# Patient Record
Sex: Male | Born: 1980 | Race: White | Hispanic: No | Marital: Single | Smoking: Former smoker
Health system: Southern US, Community
[De-identification: ages and names within clinical notes are randomized; demographics above are authoritative.]

## PROBLEM LIST (undated history)

## (undated) DIAGNOSIS — T8859XA Other complications of anesthesia, initial encounter: Secondary | ICD-10-CM

## (undated) DIAGNOSIS — F4310 Post-traumatic stress disorder, unspecified: Secondary | ICD-10-CM

## (undated) DIAGNOSIS — I4891 Unspecified atrial fibrillation: Secondary | ICD-10-CM

## (undated) DIAGNOSIS — G4730 Sleep apnea, unspecified: Secondary | ICD-10-CM

## (undated) DIAGNOSIS — K219 Gastro-esophageal reflux disease without esophagitis: Secondary | ICD-10-CM

## (undated) DIAGNOSIS — J342 Deviated nasal septum: Secondary | ICD-10-CM

## (undated) DIAGNOSIS — T4145XA Adverse effect of unspecified anesthetic, initial encounter: Secondary | ICD-10-CM

## (undated) DIAGNOSIS — I498 Other specified cardiac arrhythmias: Secondary | ICD-10-CM

## (undated) DIAGNOSIS — E162 Hypoglycemia, unspecified: Secondary | ICD-10-CM

## (undated) DIAGNOSIS — F411 Generalized anxiety disorder: Principal | ICD-10-CM

## (undated) DIAGNOSIS — F329 Major depressive disorder, single episode, unspecified: Secondary | ICD-10-CM

## (undated) DIAGNOSIS — I10 Essential (primary) hypertension: Secondary | ICD-10-CM

## (undated) HISTORY — DX: Major depressive disorder, single episode, unspecified: F32.9

## (undated) HISTORY — PX: CHOLECYSTECTOMY: SHX55

## (undated) HISTORY — DX: Morbid (severe) obesity due to excess calories: E66.01

## (undated) HISTORY — DX: Generalized anxiety disorder: F41.1

## (undated) HISTORY — DX: Deviated nasal septum: J34.2

## (undated) HISTORY — DX: Other specified cardiac arrhythmias: I49.8

## (undated) HISTORY — PX: DENTAL SURGERY: SHX609

## (undated) HISTORY — DX: Sleep apnea, unspecified: G47.30

## (undated) HISTORY — DX: Unspecified atrial fibrillation: I48.91

## (undated) HISTORY — PX: OTHER SURGICAL HISTORY: SHX169

---

## 1999-12-26 ENCOUNTER — Ambulatory Visit (HOSPITAL_BASED_OUTPATIENT_CLINIC_OR_DEPARTMENT_OTHER): Admission: RE | Admit: 1999-12-26 | Discharge: 1999-12-26 | Payer: Self-pay | Admitting: Psychiatry

## 2003-07-04 ENCOUNTER — Emergency Department (HOSPITAL_COMMUNITY): Admission: EM | Admit: 2003-07-04 | Discharge: 2003-07-05 | Payer: Self-pay | Admitting: Emergency Medicine

## 2003-07-14 ENCOUNTER — Ambulatory Visit (HOSPITAL_COMMUNITY): Admission: RE | Admit: 2003-07-14 | Discharge: 2003-07-15 | Payer: Self-pay | Admitting: Internal Medicine

## 2004-04-06 ENCOUNTER — Ambulatory Visit: Payer: Self-pay | Admitting: Internal Medicine

## 2004-08-31 ENCOUNTER — Emergency Department (HOSPITAL_COMMUNITY): Admission: EM | Admit: 2004-08-31 | Discharge: 2004-09-01 | Payer: Self-pay

## 2004-09-06 ENCOUNTER — Ambulatory Visit: Payer: Self-pay | Admitting: Internal Medicine

## 2004-09-20 ENCOUNTER — Ambulatory Visit: Payer: Self-pay | Admitting: Internal Medicine

## 2004-10-09 ENCOUNTER — Encounter: Admission: RE | Admit: 2004-10-09 | Discharge: 2004-10-09 | Payer: Self-pay | Admitting: Sports Medicine

## 2005-04-23 ENCOUNTER — Ambulatory Visit: Payer: Self-pay | Admitting: Family Medicine

## 2005-07-19 ENCOUNTER — Ambulatory Visit: Payer: Self-pay | Admitting: Internal Medicine

## 2005-07-27 ENCOUNTER — Ambulatory Visit: Payer: Self-pay | Admitting: Internal Medicine

## 2005-08-06 ENCOUNTER — Ambulatory Visit: Payer: Self-pay | Admitting: Internal Medicine

## 2006-01-08 ENCOUNTER — Ambulatory Visit: Payer: Self-pay | Admitting: Family Medicine

## 2007-02-05 ENCOUNTER — Ambulatory Visit: Payer: Self-pay | Admitting: Internal Medicine

## 2007-03-27 DIAGNOSIS — F411 Generalized anxiety disorder: Secondary | ICD-10-CM

## 2007-03-27 DIAGNOSIS — G4730 Sleep apnea, unspecified: Secondary | ICD-10-CM

## 2007-03-27 DIAGNOSIS — G4733 Obstructive sleep apnea (adult) (pediatric): Secondary | ICD-10-CM

## 2007-03-27 DIAGNOSIS — I498 Other specified cardiac arrhythmias: Secondary | ICD-10-CM

## 2007-03-27 HISTORY — DX: Generalized anxiety disorder: F41.1

## 2007-03-27 HISTORY — DX: Sleep apnea, unspecified: G47.30

## 2007-03-27 HISTORY — DX: Other specified cardiac arrhythmias: I49.8

## 2007-04-09 ENCOUNTER — Ambulatory Visit: Payer: Self-pay | Admitting: Internal Medicine

## 2007-04-09 LAB — CONVERTED CEMR LAB
AST: 23 units/L (ref 0–37)
Albumin: 4 g/dL (ref 3.5–5.2)
Basophils Relative: 0.7 % (ref 0.0–1.0)
Bilirubin, Direct: 0.1 mg/dL (ref 0.0–0.3)
Blood in Urine, dipstick: NEGATIVE
Chloride: 103 meq/L (ref 96–112)
Creatinine, Ser: 0.9 mg/dL (ref 0.4–1.5)
Creatinine,U: 124.7 mg/dL
Direct LDL: 96.1 mg/dL
Eosinophils Relative: 7.9 % — ABNORMAL HIGH (ref 0.0–5.0)
Glucose, Bld: 102 mg/dL — ABNORMAL HIGH (ref 70–99)
HCT: 44.2 % (ref 39.0–52.0)
Neutrophils Relative %: 64.2 % (ref 43.0–77.0)
Nitrite: NEGATIVE
Protein, U semiquant: NEGATIVE
RBC: 5.14 M/uL (ref 4.22–5.81)
RDW: 12 % (ref 11.5–14.6)
Sodium: 140 meq/L (ref 135–145)
Total Bilirubin: 0.6 mg/dL (ref 0.3–1.2)
Total CHOL/HDL Ratio: 6.6
Triglycerides: 224 mg/dL (ref 0–149)
WBC Urine, dipstick: NEGATIVE
WBC: 7.8 10*3/uL (ref 4.5–10.5)

## 2007-04-15 ENCOUNTER — Ambulatory Visit: Payer: Self-pay | Admitting: Internal Medicine

## 2007-04-15 DIAGNOSIS — J342 Deviated nasal septum: Secondary | ICD-10-CM

## 2007-04-15 HISTORY — DX: Deviated nasal septum: J34.2

## 2007-04-23 ENCOUNTER — Telehealth: Payer: Self-pay | Admitting: *Deleted

## 2007-05-19 ENCOUNTER — Encounter: Payer: Self-pay | Admitting: Internal Medicine

## 2007-06-16 ENCOUNTER — Ambulatory Visit: Payer: Self-pay | Admitting: Internal Medicine

## 2007-06-16 DIAGNOSIS — I4891 Unspecified atrial fibrillation: Secondary | ICD-10-CM | POA: Insufficient documentation

## 2007-06-16 DIAGNOSIS — F329 Major depressive disorder, single episode, unspecified: Secondary | ICD-10-CM

## 2007-06-16 DIAGNOSIS — F3289 Other specified depressive episodes: Secondary | ICD-10-CM

## 2007-06-16 HISTORY — DX: Unspecified atrial fibrillation: I48.91

## 2007-06-16 HISTORY — DX: Other specified depressive episodes: F32.89

## 2007-06-16 HISTORY — DX: Major depressive disorder, single episode, unspecified: F32.9

## 2007-07-31 ENCOUNTER — Ambulatory Visit: Payer: Self-pay | Admitting: Internal Medicine

## 2007-07-31 DIAGNOSIS — L270 Generalized skin eruption due to drugs and medicaments taken internally: Secondary | ICD-10-CM | POA: Insufficient documentation

## 2007-08-18 ENCOUNTER — Telehealth: Payer: Self-pay | Admitting: Internal Medicine

## 2007-11-03 ENCOUNTER — Telehealth: Payer: Self-pay | Admitting: Internal Medicine

## 2007-11-18 ENCOUNTER — Ambulatory Visit: Payer: Self-pay | Admitting: Internal Medicine

## 2008-01-29 ENCOUNTER — Ambulatory Visit: Payer: Self-pay | Admitting: Internal Medicine

## 2008-03-12 ENCOUNTER — Ambulatory Visit: Payer: Self-pay | Admitting: Internal Medicine

## 2008-03-12 HISTORY — DX: Morbid (severe) obesity due to excess calories: E66.01

## 2008-07-15 ENCOUNTER — Ambulatory Visit: Payer: Self-pay | Admitting: Internal Medicine

## 2008-11-29 ENCOUNTER — Emergency Department (HOSPITAL_BASED_OUTPATIENT_CLINIC_OR_DEPARTMENT_OTHER): Admission: EM | Admit: 2008-11-29 | Discharge: 2008-11-29 | Payer: Self-pay | Admitting: Emergency Medicine

## 2008-11-29 ENCOUNTER — Ambulatory Visit: Payer: Self-pay | Admitting: Diagnostic Radiology

## 2009-01-05 ENCOUNTER — Ambulatory Visit (HOSPITAL_COMMUNITY): Admission: RE | Admit: 2009-01-05 | Discharge: 2009-01-05 | Payer: Self-pay | Admitting: General Surgery

## 2009-01-05 ENCOUNTER — Encounter (INDEPENDENT_AMBULATORY_CARE_PROVIDER_SITE_OTHER): Payer: Self-pay | Admitting: General Surgery

## 2009-02-16 ENCOUNTER — Ambulatory Visit: Payer: Self-pay | Admitting: Internal Medicine

## 2010-03-08 ENCOUNTER — Emergency Department (HOSPITAL_BASED_OUTPATIENT_CLINIC_OR_DEPARTMENT_OTHER): Admission: EM | Admit: 2010-03-08 | Discharge: 2010-03-08 | Payer: Self-pay | Admitting: Emergency Medicine

## 2010-03-20 ENCOUNTER — Emergency Department (HOSPITAL_BASED_OUTPATIENT_CLINIC_OR_DEPARTMENT_OTHER): Admission: EM | Admit: 2010-03-20 | Discharge: 2010-03-20 | Payer: Self-pay | Admitting: Emergency Medicine

## 2010-07-05 ENCOUNTER — Other Ambulatory Visit: Payer: Self-pay | Admitting: Internal Medicine

## 2010-07-06 ENCOUNTER — Telehealth: Payer: Self-pay | Admitting: Internal Medicine

## 2010-07-06 NOTE — Telephone Encounter (Signed)
Pt called to adv that he needs a refill on med: Toprol..... [facility].

## 2010-07-07 ENCOUNTER — Ambulatory Visit (INDEPENDENT_AMBULATORY_CARE_PROVIDER_SITE_OTHER): Payer: 59 | Admitting: Internal Medicine

## 2010-07-07 ENCOUNTER — Encounter: Payer: Self-pay | Admitting: Internal Medicine

## 2010-07-07 DIAGNOSIS — R002 Palpitations: Secondary | ICD-10-CM

## 2010-07-07 DIAGNOSIS — I10 Essential (primary) hypertension: Secondary | ICD-10-CM | POA: Insufficient documentation

## 2010-07-07 DIAGNOSIS — I4891 Unspecified atrial fibrillation: Secondary | ICD-10-CM

## 2010-07-07 DIAGNOSIS — F411 Generalized anxiety disorder: Secondary | ICD-10-CM

## 2010-07-07 LAB — CBC WITH DIFFERENTIAL/PLATELET
Basophils Relative: 0.5 % (ref 0.0–3.0)
Eosinophils Relative: 11.6 % — ABNORMAL HIGH (ref 0.0–5.0)
HCT: 45 % (ref 39.0–52.0)
Hemoglobin: 15.5 g/dL (ref 13.0–17.0)
Lymphs Abs: 1.6 10*3/uL (ref 0.7–4.0)
MCV: 89.1 fl (ref 78.0–100.0)
Monocytes Absolute: 0.6 10*3/uL (ref 0.1–1.0)
Neutro Abs: 5 10*3/uL (ref 1.4–7.7)
Platelets: 291 10*3/uL (ref 150.0–400.0)
WBC: 8.2 10*3/uL (ref 4.5–10.5)

## 2010-07-07 LAB — BASIC METABOLIC PANEL
BUN: 8 mg/dL (ref 6–23)
Chloride: 104 mEq/L (ref 96–112)
Potassium: 4.2 mEq/L (ref 3.5–5.1)

## 2010-07-07 MED ORDER — LORAZEPAM 1 MG PO TABS: 1.0000 mg | ORAL_TABLET | Freq: Two times a day (BID) | ORAL | Status: DC | PRN

## 2010-07-07 MED ORDER — METOPROLOL SUCCINATE ER 50 MG PO TB24: 50.0000 mg | ORAL_TABLET | Freq: Every day | ORAL | Status: DC

## 2010-07-07 NOTE — Telephone Encounter (Signed)
That is denied because he has not been seen in over a year and he has been warned 3 times that he would have no more refills without ov

## 2010-07-07 NOTE — Progress Notes (Signed)
  Subjective:    Patient ID: Luis Garcia, male    DOB: 01/28/1981, 30 y.o.   MRN: [id number]  HPI  Patient is a 30 year old white male is followed infrequently in this office but has a past medical history significant for supraventricular tachycardia which was treated with ablation therapy mild hypertension anxiety with depression. He sees a psychiatrist to prescribe his antidepressant therapy however his blood pressure medicines and his anxiety medicines have been prescribed by this office.  he denies any recurrent palpitations shortness of breath sense of rapid heart beat or lightheadedness.   he has been compliant with his Toprol. He has not had a bmet that done in over a year.  his cardiovascular risk factors are family history and hypertension the     Review of Systems     The patient has no acute complaints and his review of systems specifically he denies any ear nose and throat issues heart issues chest or lung issues GU or GI issues neurologic issues.   he is currently seen by the psychiatrist for his depression which is stable. Past Medical History  Diagnosis Date  . ANXIETY 03/27/2007  . Atrial fibrillation 06/16/2007  . DEPRESSION 06/16/2007  . Deviated nasal septum 04/15/2007  . Morbid obesity 03/12/2008  . SLEEP APNEA 03/27/2007  . SUPRAVENTRICULAR TACHYCARDIA 03/27/2007   Past Surgical History  Procedure Date  . Atrial fib ablation   . Dental surgery     reports that he quit smoking about 6 years ago. He does not have any smokeless tobacco history on file. He reports that he does not drink alcohol or use illicit drugs. family history includes Anxiety disorder in an unspecified family member; Diabetes in his mother; Hyperlipidemia in his father; and Hypertension in his father.   .  Objective:   Physical Exam      Patient is a tall white male in no apparent distress vital signs were reviewed with the patient HEENT shows pupils are equal round reactive to light and  accommodation his oropharynx is clear his neck was supple without JVD or adenopathy his lung fields were clear to auscultation and percussion heart examination revealed a regular rate and rhythm without murmur gallop or rub his abdomen was soft nontender his extremity examination revealed no cyanosis clubbing or edema his neurological examination revealed equal grips and deep tendon reflexes were equal in both knees   Assessment & Plan:    His hypertension appears stable and his current dose of Toprol this is controlling his palpitations he states that he has done extremely well on this medication since he had his ablation therapy refill the medication for one year is warranted of the neck will be obtained today to monitor his kidney function while on an antihypertensive agent.   his Klonopin will be refilled today for his anxiety he uses this medicine and as prescribed did not call for early refills and is seen by a psychologist for counseling at this time.   discussed with this patient diet exercise compliance with medications and this factor reduction.   he is not currently sexually active we discussed STD and birth control.  . I have spent more than 30 minutes examining this patient face-to-face of which over half was spent in counseling

## 2010-07-07 NOTE — Telephone Encounter (Signed)
Talked with pt's father and told him he must  Make ov before any more refills

## 2010-08-26 LAB — BASIC METABOLIC PANEL
BUN: 5 mg/dL — ABNORMAL LOW (ref 6–23)
CO2: 27 mEq/L (ref 19–32)
Calcium: 8.9 mg/dL (ref 8.4–10.5)
Creatinine, Ser: 0.76 mg/dL (ref 0.4–1.5)
GFR calc Af Amer: >60 mL/min (ref >60)
Glucose, Bld: 106 mg/dL — ABNORMAL HIGH (ref 70–99)

## 2010-08-26 LAB — CBC
MCHC: 33.9 g/dL (ref 30.0–36.0)
MCV: 88.3 fL (ref 78.0–100.0)
RDW: 12.5 % (ref 11.5–15.5)

## 2010-08-26 LAB — DIFFERENTIAL
Basophils Absolute: 0 10*3/uL (ref 0.0–0.1)
Basophils Relative: 0 % (ref 0–1)
Eosinophils Absolute: 0.5 10*3/uL (ref 0.0–0.7)
Neutro Abs: 3.3 10*3/uL (ref 1.7–7.7)
Neutrophils Relative %: 60 % (ref 43–77)

## 2010-08-27 LAB — COMPREHENSIVE METABOLIC PANEL
ALT: 25 U/L (ref 0–53)
AST: 27 U/L (ref 0–37)
Alkaline Phosphatase: 85 U/L (ref 39–117)
CO2: 29 mEq/L (ref 19–32)
Calcium: 9.1 mg/dL (ref 8.4–10.5)
GFR calc Af Amer: >60 mL/min (ref >60)
Glucose, Bld: 103 mg/dL — ABNORMAL HIGH (ref 70–99)
Potassium: 3.7 mEq/L (ref 3.5–5.1)
Sodium: 143 mEq/L (ref 135–145)
Total Protein: 8.1 g/dL (ref 6.0–8.3)

## 2010-08-27 LAB — CBC
HCT: 46.2 % (ref 39.0–52.0)
Hemoglobin: 15.6 g/dL (ref 13.0–17.0)
MCHC: 33.8 g/dL (ref 30.0–36.0)
MCV: 87.6 fL (ref 78.0–100.0)
Platelets: 266 K/uL (ref 150–400)
RBC: 5.27 MIL/uL (ref 4.22–5.81)
RDW: 11.9 % (ref 11.5–15.5)
WBC: 7.3 K/uL (ref 4.0–10.5)

## 2010-08-27 LAB — DIFFERENTIAL
Basophils Absolute: 0.1 K/uL (ref 0.0–0.1)
Basophils Relative: 1 % (ref 0–1)
Eosinophils Absolute: 0.8 10*3/uL — ABNORMAL HIGH (ref 0.0–0.7)
Eosinophils Relative: 11 % — ABNORMAL HIGH (ref 0–5)
Lymphocytes Relative: 21 % (ref 12–46)
Lymphs Abs: 1.5 10*3/uL (ref 0.7–4.0)
Monocytes Absolute: 0.5 K/uL (ref 0.1–1.0)
Monocytes Relative: 6 % (ref 3–12)
Neutro Abs: 4.4 K/uL (ref 1.7–7.7)
Neutrophils Relative %: 61 % (ref 43–77)

## 2010-08-27 LAB — COMPREHENSIVE METABOLIC PANEL WITH GFR
Albumin: 4.3 g/dL (ref 3.5–5.2)
BUN: 11 mg/dL (ref 6–23)
Chloride: 103 meq/L (ref 96–112)
Creatinine, Ser: 1 mg/dL (ref 0.4–1.5)
GFR calc non Af Amer: >60 mL/min (ref >60)
Total Bilirubin: 0.3 mg/dL (ref 0.3–1.2)

## 2010-08-27 LAB — LIPASE, BLOOD: Lipase: 109 U/L (ref 23–300)

## 2010-10-03 NOTE — Assessment & Plan Note (Signed)
[facility]                         ELECTROPHYSIOLOGY OFFICE NOTE   BRALON, ANTKOWIAK                     MRN:          [id number]  DATE:01/29/2008                            DOB:          11/23/1980    [name] returns today for followup.  He is a very pleasant young man  with a history of SVT status post ablation and hypertension and anxiety  disorder who returns today for followup.  He is also fairly markedly  obese.  His blood pressure has been under fair control.  He does admit  to dietary indiscretion, though he is trying to lose weight.  He also  has problems with severe dental caries and has had multiple teeth  extractions.   CURRENT MEDICATIONS:  Include Toprol XL 125 a day and Lexapro 25 mg 1-  1/2 tablet daily.   PHYSICAL EXAMINATION:  GENERAL:  He is a pleasant well-appearing young  man in no distress.  VITAL SIGNS:  The blood pressure was 132/80, the pulse was 72 and  regular, the respirations were 18, the weight was 318 pounds.  NECK:  No jugular distention.  LUNGS:  Clear bilateral to auscultation.  No wheezes, rales, or rhonchi  are present.  CARDIOVASCULAR:  Regular rate and rhythm.  Normal S1 and S2.  ABDOMINAL:  Soft, nontender.  EXTREMITIES:  Trace peripheral edema bilaterally.   His EKG demonstrates sinus rhythm with normal axis and intervals.   IMPRESSION:  1. History of supraventricular tachycardia status post ablation.  2. Hypertension, fairly well controlled.  3. Obesity, I have recommend that he stay on a 2500 calories per day      diet.  We will plan to see the patient back in the office in 1 year      or sooner if she has worsening symptoms.     Doylene Canning. Ladona Ridgel, MD  Electronically Signed    GWT/MedQ  DD: 01/29/2008  DT: 01/30/2008  Job #: [id number]

## 2010-10-03 NOTE — Op Note (Signed)
NAMESALIOU, Luis Garcia              ACCOUNT NO.:  [id number]   MEDICAL RECORD NO.:  [id number]          PATIENT TYPE:  AMB   LOCATION:  DAY                          FACILITY:  [facility]   PHYSICIAN:  Lennie Muckle, MD      DATE OF BIRTH:  12/10/1980   DATE OF PROCEDURE:  01/05/2009  DATE OF DISCHARGE:                               OPERATIVE REPORT   PREOPERATIVE DIAGNOSIS:  Biliary colic cystitis.   POSTOPERATIVE DIAGNOSIS:  Biliary colic cystitis.   PROCEDURE:  Laparoscopic cholecystectomy.   SURGEON:  Lennie Muckle, MD   ASSISTANT:  Consuello Bossier, M.D.   ANESTHESIA:  General endotracheal anesthesia.   FINDINGS:  Stone at the neck of the gallbladder, small cystic duct.   SPECIMENS:  Gallbladder.   ESTIMATED BLOOD LOSS:  Minimal amount of blood loss.   No immediate complications.   INDICATIONS FOR PROCEDURE:  Luis Garcia is a 30 year old male who had  acute onset right upper quadrant early in July.  He had had some  improvement with pain with IV medications from the emergency department.  Ultrasound did reveal a stone in the neck of the gallbladder.  No  pericholecystic fluid or MRSA and the white count was normal.  He had  had some intermittent abdominal pain since his visit in the emergency  department.  He had a low grade fever of 100.1.  I had talked to him  about performing a cholecystectomy.  I did not think a cholangiogram was  warranted since the stone was at the neck of the gallbladder.  I talked  him about possible open procedure, injury to the common bile duct,  biloma, and bleeding, etc.  Informed consent was obtained prior to  procedure.   DETAILS OF THE PROCEDURE:  Luis Garcia was identified in the  preoperative holding area.  He received IV Cipro and was taken to the  operating room.  Once in the operating room, placed in supine position.  After administration of general endotracheal anesthesia, his abdomen was  clipped, prepped and draped in the usual  sterile fashion.  A time-out  proceeded to confirm the patient and procedure were performed.  I placed  a Veress needle in the left upper quadrant after ensuring orogastric  tube placement.  After insufflating the abdomen, I placed an 11 meters  trocar just above the umbilicus using the OptiVu.  All layers of  abdominal wall were visualized upon entry.  I inspected the abdomen and  found no evidence of injury.  Upon placement of the trocar, Veress  needle was identified in the left upper quadrant.  I found no evidence  of injury upon placement of the Veress needle.  The Veress was removed.  I placed three 5-mm trocars in the right upper quadrant under  visualization with the camera.  The omentum was swept up over the liver.  This was swept easily away.  The fundus of the gallbladder was retracted  up to the head of the patient.  I retracted the infundibulum.  The stone  was easily palpable.  I was able to get a  critical view of the cystic  duct and artery which was medial with a posterior branch. A small amount  of bile spillage occured during the dissection.  After obtaining a  critical view, I clipped and divided the cystic duct and artery.  I  continued dissecting the gallbladder using the electrocautery.  The  gallbladder was placed in the EndoCatch bag and attempted to be removed  from the umbilical incision.  The bag did break and I retrieved the  stone in another bag.  The gallbladder was removed without difficulty.  I irrigated the abdomen with a liter of saline.  I inspected the liver  bed and found no evidence of bleeding.  There was no bile leakage from  the liver bed.  I then closed the fascial defect using a 0-0 Vicryl  suture in a suture passer.  This had good closure with the 0-0 Vicryl.  I then irrigated the supraumbilical incision.  I inspected the abdomen  and found no evidence of injury.  I then released pneumoperitoneum and  removed the trocars.  Skin was closed with  4-0 Monocryl.  Dermabond  placed around the dressing.  The patient was extubated, transferred to  post anesthesia care unit in stable condition.  He will be discharged  home today or in the morning if he is cleared from anesthesiology to be  discharged home today.      Lennie Muckle, MD  Electronically Signed     ALA/MEDQ  D:  01/05/2009  T:  01/05/2009  Job:  [id number]   cc:   Luis Glaze, MD  [address] [place]  [place] [address]

## 2010-10-03 NOTE — Assessment & Plan Note (Signed)
[facility]                         ELECTROPHYSIOLOGY OFFICE NOTE   Luis, Garcia                     MRN:          [id number]  DATE:02/05/2007                            DOB:          10/01/1980    Luis Garcia returns today for followup. He is a very pleasant, 30-year-  old man who I initially met many years ago with a diagnosis of anxiety  disorder for which he was subsequently found to have SVT and underwent  successful electrophysiologic study and catheter ablation. He clearly  still does have an anxiety disorder but his SVT is now resolved. He  notes that he still occasionally feels palpitations and has a sense like  his heart is about to start racing though it has not. The patient notes  that when he gets anxious or nervous or feels his heart beating  irregularly, he will begin to eat and has such gained over 50 pounds in  the last several years. He returns today for followup. He denies chest  pain, he denies shortness of breath. He denies peripheral edema.   MEDICATIONS:  1. Cymbalta 30 a day.  2. Toprol XL 200 a day.   PHYSICAL EXAMINATION:  GENERAL:  He is a pleasant, obese, young man in  no acute distress.  VITAL SIGNS:  The blood pressure today is 132/83, the pulse is 77 and  regular, respirations 16. The weight was 319 pounds. His weight back in  April 2005 was 276 pounds.  NECK:  Revealed no jugular venous distention.  LUNGS:  Clear bilaterally to auscultation. No wheezes, rales or rhonchi  are present.  CARDIOVASCULAR:  Regular rate and rhythm with normal S1 and S2. There  was no murmurs, rubs or gallops appreciated.  EXTREMITIES:  Demonstrated no cyanosis, clubbing or edema.  Pulses were  2+ and symmetric.   EKG demonstrates sinus rhythm with normal axis and intervals.   IMPRESSION:  1. Supraventricular tachycardia status post ablation.  2. Recurrent palpitations but no documented prolonged episodes.  3. Morbid  obesity.   DISCUSSION:  I have discussed the importance of diet as well as the  importance of regular daily exercise with [name] and his father. I am  concerned that he will ultimately become morbidly obese and develop  obesity hypoventilation syndrome and for this reason I have asked that  he  increase his physical activity by walking every day and that he decrease  his p.o. intake. We  talked about ways of eating less calorie dense food. I will plan to see  the patient back in the office in 1 year, sooner should he have  additional palpitations or other issues.     Doylene Canning. Ladona Ridgel, MD  Electronically Signed    GWT/MedQ  DD: 02/05/2007  DT: 02/06/2007  Job #: [id number]

## 2010-10-06 NOTE — Op Note (Signed)
NAME:  Luis Garcia, Luis Garcia                        ACCOUNT NO.:  [id number]   MEDICAL RECORD NO.:  [id number]                   PATIENT TYPE:  OIB   LOCATION:  2899                                 FACILITY:  [facility]   PHYSICIAN:  Doylene Canning. Ladona Ridgel, M.D.               DATE OF BIRTH:  [birth year]   DATE OF PROCEDURE:  07/14/2003  DATE OF DISCHARGE:                                 OPERATIVE REPORT   PROCEDURE PERFORMED:  Invasive electrophysiologic study and radio frequency  catheter ablation of atrioventricular node re-entrant tachycardia.   INDICATIONS FOR PROCEDURE:   INTRODUCTION:  The patient is a very pleasant 30 year old man with a long  history of tachy palpitations.  Associated with this, he has been diagnosed  with an anxiety disorder.  Over the last several years, his palpitations  have improved on high dose beta blockers; however, he is tired of taking  high dose beta blockers stating that it makes him feel tired and fatigued  and for this reason he is now referred for electrophysiologic study and  catheter ablation.   DESCRIPTION OF PROCEDURE:  After informed consent was obtained, the patient  was taken to the diagnostic electrophysiology laboratory in a fasted state.  After the usual preparation and draping, intravenous fentanyl and Midazolam  was given for sedation.  A 6 French hexapolar catheter was inserted  percutaneously into the right jugular vein and advanced to the coronary  sinus.  A 5 French quadripolar catheter was inserted percutaneously into the  right femoral vein and advanced to the His bundle region.  A 5 French  quadripolar catheter was inserted percutaneously into the right femoral vein  and advanced to the right atrium.  A 5 French catheter was inserted  percutaneously into the right femoral vein and advanced to the right  ventricle.  After measurement of the basic intervals, rapid ventricular  pacing was carried out from the right ventricular apex at a pace  cycle  length of 500 msec and stepwise decreased down to 250 msec where VA  Wenckebach was observed.  During rapid ventricular pacing the atrial  activation was midline and decremental.  Next, programmed ventricular  stimulation was carried out from the RV apex at a basic drive cycle length  of 098 msec.  The S1-S2 interval was stepwise decreased from 340 msec down  to 200 msec where ventricular refractoriness was observed.  Again during  programmed ventricular stimulation there was midline decremental atrial  activation and no inducible SVT.  Next, programmed atrial stimulation was  carried out from the coronary sinus as well as from the high right atrium at  a basic drive cycle length of 119 msec.  The S1-S2 interval was stepwise  decreased from 400 msec down to 260 msec resulting in the initiation of SVT.  During the SVT the atrial activation was midline and the VA time was quite  short.  The tachycardia cycle length  varied between 290 and 330 msec.  It  was sustained but spontaneously terminate and also terminate with rapid  ventricular pacing.  Next, rapid atrial pacing was carried out from the high  right atrium as well as the coronary sinus at pace cycle length of 490 msec.  The pace cycle length was stepwise reduced down to 320 msec resulting again  in the initiation of SVT.  Again this was AV node re-entry tachycardia at a  cycle length that varied from 290 to 330 msec.  During tachycardia mapping  was carried out demonstrating the usual size of Koch's triangle and  orientation of Koch's triangle.  At this point the ablation catheter was  maneuvered into Koch's triangle. Three radio frequency energy applications  were delivered to sites 7 through 9 in Koch's triangle.  During radio  frequency energy application there was prolonged accelerated junctional  rhythm.  Following ablation the patient was observed for 45 minutes.  Additional rapid atrial pacing, programmed atrial  stimulation, rapid  ventricular pacing and programmed ventricular stimulation was carried out.  There was no inducible SVT and no residual evidence of any slow pathway  conduction.  The catheter was then removed.  Hemostasis was assured and the  patient returned to his room in satisfactory condition.   COMPLICATIONS:  There were no immediate procedural complications.   RESULTS:  a.  Baseline electrocardiogram.  The baseline ECG demonstrates  sinus tachycardia with normal axis and intervals.  b.  Baseline intervals.  The sinus node cycle length was 595 msec.  The QRS  duration of 95 msec.  The HV interval 41 msec.  The AH interval 70 msec.  Following ablation the AH interval was 70 msec and the HV interval 51 msec.  c.  Rapid ventricular pacing.  Rapid ventricular pacing was carried out from  the RV apex and stepwise decreased down to 250 msec where VA Wenckebach was  observed.  During rapid ventricular pacing, the atrial activation was  midline and decremental.  d.  Programmed ventricular stimulation.  Programmed ventricular stimulation  was carried out from the right ventricular apex at a basic drive cycle  length of 161 msec.  The S1-S2 interval was stepwise decreased from 340 msec  down to 220 msec where ventricular refractoriness was observed.  During  programmed ventricular stimulation the atrial activation sequence was  midline and decremental.  There was no inducible SVT during programmed  ventricular stimulation.  e.  Programmed atrial stimulation.  Programmed atrial stimulation was  carried out from the coronary sinus at basic drive cycle length of 096 msec.  The S1-S2 interval was stepwise decreased from 400 msec down to 260 msec  resulting in the initiation of SVT.  Following catheter ablation, additional  attempts at programmed atrial stimulation were carried out with the S1-S2  interval carried out down to 320 msec where the AV node ERP was observed  postablation. f.   Rapid atrial pacing.  Prior to ablation, rapid atrial pacing was carried  out from both the coronary sinuses as well as the high right atrium at a  pace cycle length of 490 msec and stepwise decreased down to 320 msec  resulting in the initiation of SVT.  During rapid atrial pacing the P-R  interval was initially greater than the R-R interval with the initiation of  SVT.  Following catheter ablation, the AV Wenckebach cycle length was 380  msec and the P-R interval was less than the R-R interval.  g.  Arrhythmias observed.  (1)  AV node re-entrant tachycardia.  Initiation  programmed atrial stimulation and rapid atrial pacing, duration was  sustained.  Cycle length varied between 330 and 290 msec.  The method of  induction was with programmed atrial stimulation as well as rapid atrial  pacing.  Method of termination was spontaneous as well as ventricular  pacing.  h.  Mapping.  Mapping of Koch's triangle demonstrated the usual size and  orientation.  a. Radio frequency Psychologist, educational.  A total of three radio frequency     energy applications were delivered.  During radio frequency energy     application, there was prolonged accelerated junctional rhythm.     Following ablation there was no inducible SVT and no slow pathway     conduction.   CONCLUSION:  This study demonstrated successful electrophysiologic study and  radio frequency catheter ablation of usual AV node re-entry tachycardia with  a total of three RF energy applications delivered to sites 7 through 9 in  Koch's triangle.  Following catheter ablation there was no inducible  tachycardia and no residual evidence of any slow pathway conduction.                                               Doylene Canning. Ladona Ridgel, M.D.    GWT/MEDQ  D:  07/14/2003  T:  07/14/2003  Job:  [id number]   cc:   Stacie Glaze, M.D. [facility]

## 2010-10-06 NOTE — Discharge Summary (Signed)
NAME:  Luis Garcia, Luis Garcia                        ACCOUNT NO.:  [id number]   MEDICAL RECORD NO.:  [id number]                   PATIENT TYPE:  OIB   LOCATION:  2040                                 FACILITY:  [facility]   PHYSICIAN:  Doylene Canning. Ladona Ridgel, M.D.               DATE OF BIRTH:  12-May-1981   DATE OF ADMISSION:  07/14/2003  DATE OF DISCHARGE:  07/15/2003                                 DISCHARGE SUMMARY   PRIMARY DIAGNOSIS:  Supraventricular tachycardia.   SECONDARY DIAGNOSIS:  Anxiety disorder.   HISTORY OF PRESENT ILLNESS:  This is a 30 year old gentleman with a history  of tachy-palpitations associated with anxiety disorder.  Over the last  several years, his palpitations have improved on high-dose beta blocker,  however, he is tired of taking the high-dose beta blockers, stating that  they makes him feel tired and fatigued; for this reason, he was referred for  an EP study and a catheter ablation.   HOSPITAL COURSE:  The patient was admitted and underwent EP study and a  successful slow pathway modification radiofrequency ablation __________  7  to 9 in the [facility] triangle, rendering the SVT non-inducible.  The patient  tolerated the procedure well, had no immediate postop complications and was  discharged the following day in stable condition.   DISCHARGE MEDICATIONS:  He was discharged on:  1. Toprol 100 mg daily.  2. Wellbutrin 150 mg daily.  3. Imipramine 100 daily.  4. Tylenol 1 to 2 tabs every 4-6 hours as needed.   ACTIVITY:  No heavy lifting or strenuous activity for 4 days, no driving for  2 days.   DIET:  Low-fat, low-salt, low-cholesterol diet.   SPECIAL DISCHARGE INSTRUCTIONS:  He is to call if he develops a lump or any  drainage in his neck or groin and to follow with Dr. Doylene Canning. Ladona Ridgel, August 27, 2003, at 10:45 a.m.      Chinita Pester, C.R.N.P. [facility]                 Doylene Canning. Ladona Ridgel, M.D.    DS/MEDQ  D:  07/15/2003  T:  07/16/2003  Job:  [id number]   cc:   Doylene Canning. Ladona Ridgel, M.D.   Stacie Glaze, M.D. [facility]

## 2011-02-04 ENCOUNTER — Other Ambulatory Visit: Payer: Self-pay | Admitting: Internal Medicine

## 2011-03-05 ENCOUNTER — Other Ambulatory Visit: Payer: Self-pay | Admitting: Internal Medicine

## 2011-04-05 ENCOUNTER — Other Ambulatory Visit: Payer: Self-pay | Admitting: Internal Medicine

## 2011-05-05 ENCOUNTER — Other Ambulatory Visit: Payer: Self-pay | Admitting: Internal Medicine

## 2011-06-04 ENCOUNTER — Other Ambulatory Visit: Payer: Self-pay | Admitting: Internal Medicine

## 2011-07-02 ENCOUNTER — Other Ambulatory Visit: Payer: Self-pay | Admitting: *Deleted

## 2011-07-02 DIAGNOSIS — I10 Essential (primary) hypertension: Secondary | ICD-10-CM

## 2011-07-02 MED ORDER — METOPROLOL SUCCINATE ER 50 MG PO TB24: ORAL_TABLET | ORAL | Status: DC

## 2011-07-05 ENCOUNTER — Other Ambulatory Visit: Payer: Self-pay | Admitting: *Deleted

## 2011-07-05 MED ORDER — ESCITALOPRAM OXALATE 20 MG PO TABS: 20.0000 mg | ORAL_TABLET | Freq: Every day | ORAL | Status: DC

## 2011-08-07 ENCOUNTER — Ambulatory Visit (INDEPENDENT_AMBULATORY_CARE_PROVIDER_SITE_OTHER): Payer: 59 | Admitting: Internal Medicine

## 2011-08-07 ENCOUNTER — Encounter: Payer: Self-pay | Admitting: Internal Medicine

## 2011-08-07 DIAGNOSIS — F411 Generalized anxiety disorder: Secondary | ICD-10-CM

## 2011-08-07 MED ORDER — LORAZEPAM 1 MG PO TABS: 1.0000 mg | ORAL_TABLET | Freq: Two times a day (BID) | ORAL | Status: DC | PRN

## 2011-08-07 MED ORDER — ESCITALOPRAM OXALATE 20 MG PO TABS: 20.0000 mg | ORAL_TABLET | Freq: Every day | ORAL | Status: DC

## 2011-08-07 NOTE — Patient Instructions (Signed)
A very quick move it will help you lose a considerable bladder weight is getting with a soft drink with sugar and also avoiding diet soft drinks because the artificial sweetener in diet soft drinks make she desire more sugar Try to drink more water or flavored waters   At the grocery store there are fruit smoothies that contained fruit and vegetables in the vegetable section at the grocery store on brand I really like is called  Naked  The green machine is my favorite it contains vegetables fruit and all the vitamins you need for a meal replacement   G-2 is an okay Gatorade

## 2011-08-07 NOTE — Progress Notes (Signed)
  Subjective:    Patient ID: Luis Garcia, male    DOB: 02-Nov-1980, 31 y.o.   MRN: [id number]  HPI Luis Garcia is a morbidly obese 31 year old white male who has a history of atrial fibrillation status post ablation therapy and long-standing anxiety with depression.  He also has sleep apnea for which he is noncompliant.  He is followed also for hypertension. He is there in the presence of a family member in the engage them both a discussion of his morbid obesity and the potential impact on his longevity as well as the multiple medical problems and that contributes to at this point.  At age 31 he is been morbidly obese for the last 10 years and would be an excellent candidate for obesity surgery   Review of Systems  Constitutional: Negative for fever and fatigue.       Obese  HENT: Negative for hearing loss, congestion, neck pain and postnasal drip.   Eyes: Negative for discharge, redness and visual disturbance.  Respiratory: Negative for cough, shortness of breath and wheezing.   Cardiovascular: Negative for leg swelling.  Gastrointestinal: Negative for abdominal pain, constipation and abdominal distention.  Genitourinary: Negative for urgency and frequency.  Musculoskeletal: Negative for joint swelling and arthralgias.  Skin: Negative for color change and rash.  Neurological: Negative for weakness and light-headedness.  Hematological: Negative for adenopathy.  Psychiatric/Behavioral: Negative for behavioral problems.       Objective:   Physical Exam  Constitutional: He appears well-developed and well-nourished.       Morbidly obese  HENT:  Head: Normocephalic and atraumatic.  Eyes: Conjunctivae are normal. Pupils are equal, round, and reactive to light.  Neck: Normal range of motion. Neck supple.  Cardiovascular: Normal rate and regular rhythm.   Pulmonary/Chest: Effort normal and breath sounds normal.  Abdominal: Soft. Bowel sounds are normal.            Assessment & Plan:  We will refill his medication for his hypertension in the setting of a history of atrial fibrillation and his history of depression reflux esophagitis and sleep apnea were also addressed and discussed in the setting of his morbid obesity roll for diet referral to bariatric surgery were offered this patient.  I have spent more than 30 minutes examining this patient face-to-face of which over half was spent in counseling

## 2011-08-27 ENCOUNTER — Other Ambulatory Visit: Payer: Self-pay | Admitting: Internal Medicine

## 2011-10-24 ENCOUNTER — Other Ambulatory Visit: Payer: Self-pay | Admitting: Internal Medicine

## 2011-11-07 ENCOUNTER — Ambulatory Visit: Payer: 59 | Admitting: Internal Medicine

## 2011-12-20 ENCOUNTER — Other Ambulatory Visit: Payer: Self-pay | Admitting: Internal Medicine

## 2012-01-07 ENCOUNTER — Ambulatory Visit (INDEPENDENT_AMBULATORY_CARE_PROVIDER_SITE_OTHER): Payer: 59 | Admitting: Internal Medicine

## 2012-01-07 ENCOUNTER — Encounter: Payer: Self-pay | Admitting: Internal Medicine

## 2012-01-07 VITALS — BP 120/80 | HR 72 | Temp 98.6°F | Resp 16 | Ht 77.5 in | Wt 345.0 lb

## 2012-01-07 DIAGNOSIS — I10 Essential (primary) hypertension: Secondary | ICD-10-CM

## 2012-01-07 DIAGNOSIS — F418 Other specified anxiety disorders: Secondary | ICD-10-CM

## 2012-01-07 DIAGNOSIS — F341 Dysthymic disorder: Secondary | ICD-10-CM

## 2012-01-07 MED ORDER — CITALOPRAM HYDROBROMIDE 40 MG PO TABS: 40.0000 mg | ORAL_TABLET | Freq: Every day | ORAL | Status: DC

## 2012-01-07 MED ORDER — METOPROLOL SUCCINATE ER 100 MG PO TB24: 50.0000 mg | ORAL_TABLET | Freq: Every day | ORAL | Status: DC

## 2012-01-07 NOTE — Progress Notes (Signed)
Subjective:    Patient ID: Luis Garcia, male    DOB: 12-23-1980, 31 y.o.   MRN: [id number]  Anxiety Patient reports no shortness of breath.    Hypertension Associated symptoms include anxiety. Pertinent negatives include no neck pain or shortness of breath.  Atrial Fibrillation Symptoms include hypertension. Symptoms are negative for shortness of breath and weakness. Past medical history includes atrial fibrillation.      Review of Systems  Constitutional: Negative for fever and fatigue.  HENT: Negative for hearing loss, congestion, neck pain and postnasal drip.   Eyes: Negative for discharge, redness and visual disturbance.  Respiratory: Negative for cough, shortness of breath and wheezing.   Cardiovascular: Negative for leg swelling.  Gastrointestinal: Negative for abdominal pain, constipation and abdominal distention.  Genitourinary: Negative for urgency and frequency.  Musculoskeletal: Negative for joint swelling and arthralgias.  Skin: Negative for color change and rash.  Neurological: Negative for weakness and light-headedness.  Hematological: Negative for adenopathy.  Psychiatric/Behavioral: Negative for behavioral problems.   Past Medical History  Diagnosis Date  . ANXIETY 03/27/2007  . Atrial fibrillation 06/16/2007  . DEPRESSION 06/16/2007  . Deviated nasal septum 04/15/2007  . Morbid obesity 03/12/2008  . SLEEP APNEA 03/27/2007  . 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 Marital Status: Single    Spouse Name: N/A    Number of Children: N/A  . Years of Education: N/A   Occupational History  . Not on file.   Social History Main Topics  . Smoking status: Former Smoker    Quit date: 07/07/2004  . Smokeless tobacco: Not on file   Comment: stoped in 2008- smoked 1/2 ppd for 8 years  . Alcohol Use: No     occasionally  . Drug Use: No  . Sexually Active: Yes   Other Topics Concern  . Not on file   Social History  Narrative  . No narrative on file    Past Surgical History  Procedure Date  . Atrial fib ablation   . Dental surgery     Family History  Problem Relation Age of Onset  . Anxiety disorder    . Diabetes Mother   . Hypertension Father   . Hyperlipidemia Father     Allergies  Allergen Reactions  . Codeine Rash  . Sulfa Antibiotics Rash    Current Outpatient Prescriptions on File Prior to Visit  Medication Sig Dispense Refill  . escitalopram (LEXAPRO) 20 MG tablet TAKE 1 TABLET EVERY DAY  30 tablet  1  . LORazepam (ATIVAN) 1 MG tablet Take 1 tablet (1 mg total) by mouth 2 (two) times daily as needed.  60 tablet  5  . metoprolol succinate (TOPROL-XL) 50 MG 24 hr tablet TAKE 2 AND 1/2 TABLETS DAILY (NEED APPT)  75 tablet  3  . pantoprazole (PROTONIX) 40 MG tablet Take 40 mg by mouth daily.          BP 120/80  Pulse 72  Temp 98.6 F (37 C)  Resp 16  Ht 6' 5.5" (1.969 m)  Wt 345 lb (156.491 kg)  BMI 40.38 kg/m2       Objective:   Physical Exam  Nursing note and vitals reviewed. Constitutional: He appears well-developed and well-nourished.  HENT:  Head: Normocephalic and atraumatic.  Eyes: Conjunctivae are normal. Pupils are equal, round, and reactive to light.  Neck: Normal range of motion. Neck supple.  Cardiovascular: Normal rate and regular rhythm.   Pulmonary/Chest: Effort normal  and breath sounds normal.  Abdominal: Soft. Bowel sounds are normal.          Assessment & Plan:  Morbid obesity isn't this patient primary problem We discussed diet and exercise his primary interventions.  He's had a history of paroxysmal atrial fibrillation but is in sinus rhythm at this time has a history of anxiety with panic attack probably exacerbated by the paroxysmal fibrillation.  He has sleep apnea hypertension and multiple medical problems that are related to his morbid obesity we discussed this with his father and himself as the patient is about to lose insurance  under his father's policy.

## 2012-02-28 ENCOUNTER — Other Ambulatory Visit: Payer: Self-pay | Admitting: Internal Medicine

## 2012-05-05 ENCOUNTER — Ambulatory Visit: Payer: 59 | Admitting: Internal Medicine

## 2012-05-08 ENCOUNTER — Other Ambulatory Visit: Payer: Self-pay | Admitting: Internal Medicine

## 2012-06-10 ENCOUNTER — Other Ambulatory Visit: Payer: Self-pay | Admitting: Internal Medicine

## 2012-07-08 ENCOUNTER — Other Ambulatory Visit: Payer: Self-pay | Admitting: Internal Medicine

## 2012-08-04 ENCOUNTER — Encounter: Payer: Self-pay | Admitting: Internal Medicine

## 2012-08-04 ENCOUNTER — Ambulatory Visit (INDEPENDENT_AMBULATORY_CARE_PROVIDER_SITE_OTHER): Payer: Self-pay | Admitting: Internal Medicine

## 2012-08-04 VITALS — BP 110/78 | HR 74 | Temp 98.5°F | Wt 354.0 lb

## 2012-08-04 DIAGNOSIS — E669 Obesity, unspecified: Secondary | ICD-10-CM

## 2012-08-04 DIAGNOSIS — I4891 Unspecified atrial fibrillation: Secondary | ICD-10-CM

## 2012-08-04 DIAGNOSIS — I10 Essential (primary) hypertension: Secondary | ICD-10-CM

## 2012-08-04 NOTE — Progress Notes (Signed)
  Subjective:    Patient ID: Luis Garcia, male    DOB: 11-08-1980, 32 y.o.   MRN: [id number]  HPI    Review of Systems  Constitutional: Negative for fever and fatigue.  HENT: Positive for nosebleeds, congestion, rhinorrhea and postnasal drip. Negative for hearing loss and neck pain.   Eyes: Negative for discharge, redness and visual disturbance.  Respiratory: Negative for cough, shortness of breath and wheezing.   Cardiovascular: Negative for leg swelling.  Gastrointestinal: Negative for abdominal pain, constipation and abdominal distention.  Genitourinary: Negative for urgency and frequency.  Musculoskeletal: Negative for joint swelling and arthralgias.  Skin: Negative for color change and rash.  Neurological: Negative for weakness and light-headedness.  Hematological: Negative for adenopathy.  Psychiatric/Behavioral: Negative for behavioral problems.       Objective:   Physical Exam  Constitutional: He appears well-developed and well-nourished.  HENT:  Head: Normocephalic and atraumatic.  Swollen inflamed turbnates with discharge  Eyes: Conjunctivae are normal. Pupils are equal, round, and reactive to light.  Neck: Normal range of motion. Neck supple.  Cardiovascular: Normal rate and regular rhythm.   Pulmonary/Chest: Effort normal and breath sounds normal.  Abdominal: Soft. Bowel sounds are normal.          Assessment & Plan:  Sinus infection avelox for 7 days

## 2012-08-04 NOTE — Patient Instructions (Signed)
Sugary drinks and bread and pasta put the weight in the middle

## 2012-08-05 ENCOUNTER — Other Ambulatory Visit: Payer: Self-pay | Admitting: Internal Medicine

## 2012-09-08 ENCOUNTER — Other Ambulatory Visit: Payer: Self-pay | Admitting: Internal Medicine

## 2012-09-29 ENCOUNTER — Ambulatory Visit (INDEPENDENT_AMBULATORY_CARE_PROVIDER_SITE_OTHER): Payer: BC Managed Care – PPO | Admitting: Family

## 2012-09-29 ENCOUNTER — Encounter: Payer: Self-pay | Admitting: Family

## 2012-09-29 VITALS — BP 118/80 | HR 94 | Wt 351.7 lb

## 2012-09-29 DIAGNOSIS — F3289 Other specified depressive episodes: Secondary | ICD-10-CM

## 2012-09-29 DIAGNOSIS — R5383 Other fatigue: Secondary | ICD-10-CM

## 2012-09-29 DIAGNOSIS — F329 Major depressive disorder, single episode, unspecified: Secondary | ICD-10-CM

## 2012-09-29 DIAGNOSIS — F32A Depression, unspecified: Secondary | ICD-10-CM

## 2012-09-29 DIAGNOSIS — Z8679 Personal history of other diseases of the circulatory system: Secondary | ICD-10-CM

## 2012-09-29 DIAGNOSIS — R5381 Other malaise: Secondary | ICD-10-CM

## 2012-09-29 DIAGNOSIS — F411 Generalized anxiety disorder: Secondary | ICD-10-CM

## 2012-09-29 DIAGNOSIS — G4700 Insomnia, unspecified: Secondary | ICD-10-CM

## 2012-09-29 LAB — T4: T4, Total: 10.1 ug/dL (ref 5.0–12.5)

## 2012-09-29 LAB — T3: T3, Total: 116.4 ng/dL (ref 80.0–204.0)

## 2012-09-29 NOTE — Patient Instructions (Addendum)

## 2012-09-30 ENCOUNTER — Encounter: Payer: Self-pay | Admitting: Family

## 2012-09-30 LAB — COMPREHENSIVE METABOLIC PANEL
ALT: 30 U/L (ref 0–53)
CO2: 28 mEq/L (ref 19–32)
Creatinine, Ser: 0.8 mg/dL (ref 0.4–1.5)
GFR: 111.63 mL/min (ref >60.00)
Total Bilirubin: 0.3 mg/dL (ref 0.3–1.2)

## 2012-09-30 LAB — CBC WITH DIFFERENTIAL/PLATELET
Eosinophils Relative: 6.4 % — ABNORMAL HIGH (ref 0.0–5.0)
HCT: 44 % (ref 39.0–52.0)
Lymphocytes Relative: 16.5 % (ref 12.0–46.0)
Monocytes Relative: 7.7 % (ref 3.0–12.0)
Neutrophils Relative %: 68.2 % (ref 43.0–77.0)
Platelets: 288 10*3/uL (ref 150.0–400.0)
WBC: 7.3 10*3/uL (ref 4.5–10.5)

## 2012-09-30 LAB — TSH: TSH: 1.17 u[IU]/mL (ref 0.35–5.50)

## 2012-09-30 NOTE — Progress Notes (Signed)
Subjective:    Patient ID: Luis Garcia, male    DOB: [birth year], 32 y.o.   MRN: [id number]  HPI And and 32 year old white male, nonsmoker, patient of Dr. Lovell Sheehan is in today with complaints of insomnia and fatigue has been ongoing x2 months but worsening over the last 2 days. Patient reports difficulty falling asleep as well as maintaining sleep. He reports an increase in amount of stress being in the [employer]. Reports some room or school around about him that are true. He reports not being sure exactly what being said. He is financially dependent on his mother he has no, is unemployed. Reports losing his job because the people so he was crazy. 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  reports a history of SVT. Reports feeling these symptoms and his heart was out of rhythm previously. He had an ablation performed.    Review of Systems  Constitutional: Positive for fatigue.  HENT: Negative.   Respiratory: Negative.   Cardiovascular: Positive for palpitations. Negative for chest pain and leg swelling.  Gastrointestinal: Negative.   Musculoskeletal: Negative.   Skin: Negative.   Allergic/Immunologic: Negative.   Neurological: Negative.   Hematological: Negative.   Psychiatric/Behavioral: Positive for sleep disturbance. The patient is nervous/anxious.    Past Medical History  Diagnosis Date  . ANXIETY 03/27/2007  . Atrial fibrillation 06/16/2007  . DEPRESSION 06/16/2007  . Deviated nasal septum 04/15/2007  . Morbid obesity 03/12/2008  . SLEEP APNEA 03/27/2007  . 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 Marital Status: Single    Spouse Name: N/A    Number of Children: N/A  . Years of Education: N/A   Occupational History  . Not on file.   Social History Main Topics  . Smoking status: Former Smoker    Quit date: 07/07/2004  . Smokeless tobacco: Not on file     Comment: stoped in 2008- smoked 1/2 ppd  for 8 years  . Alcohol Use: No     Comment: occasionally  . Drug Use: No  . Sexually Active: Yes   Other Topics Concern  . Not on file   Social History Narrative  . No narrative on file    Past Surgical History  Procedure Laterality Date  . Atrial fib ablation    . Dental surgery      Family History  Problem Relation Age of Onset  . Anxiety disorder    . Diabetes Mother   . Hypertension Father   . Hyperlipidemia Father     Allergies  Allergen Reactions  . Codeine Rash  . Sulfa Antibiotics Rash    Current Outpatient Prescriptions on File Prior to Visit  Medication Sig Dispense Refill  . escitalopram (LEXAPRO) 20 MG tablet TAKE 1 TABLET BY MOUTH ONCE A DAY  30 tablet  0  . metoprolol succinate (TOPROL-XL) 100 MG 24 hr tablet Take 1 tablet (100 mg total) by mouth daily. Take with or immediately following a meal.  30 tablet  11  . pantoprazole (PROTONIX) 40 MG tablet Take 40 mg by mouth daily.         No current facility-administered medications on file prior to visit.    BP 118/80  Pulse 94  Wt 351 lb 11.2 oz (159.53 kg)  BMI 41.15 kg/m2  SpO2 98%chart    Objective:   Physical Exam  Constitutional: He is oriented to person, place, and time. He  appears well-developed and well-nourished.  HENT:  Right Ear: External ear normal.  Left Ear: External ear normal.  Nose: Nose normal.  Mouth/Throat: Oropharynx is clear and moist.  Neck: Normal range of motion. Neck supple. No thyromegaly present.  Cardiovascular: Normal rate, regular rhythm and normal heart sounds.   Pulmonary/Chest: Effort normal and breath sounds normal.  Abdominal: Soft. Bowel sounds are normal.  Neurological: He is alert and oriented to person, place, and time.  Skin: Skin is warm and dry.  Psychiatric:  Scattered thoughts. Difficulty focusing     EKG: Normal sinus rhythm       Assessment & Plan:   assessment:  1. Insomnia 2. Fatigue 3. Depression 4. History of SVT 5. Obstructive  sleep apnea 6. Obesity   Plan: I strongly believe the patient's symptoms are related to anxiety/depression probably bipolar disorder. He has a history of bipolar disorder although his family does not believe his bipolar. Continue current medications. We'll send labs to include CBC, TSH, CMP, Epstein-Barr IgM. Will notify patient pending results. Given his cardiac history, I will refer to cardiology since he has not seen them in several years. I do not believe his symptoms are related to SVT or cardiac related. However, we will get clearance. Consider an adjustment to his SSRI or possibly changing.

## 2012-10-01 ENCOUNTER — Telehealth: Payer: Self-pay | Admitting: Internal Medicine

## 2012-10-01 LAB — EPSTEIN-BARR VIRUS VCA, IGM: EBV VCA IgM: <10 U/mL (ref <36.0)

## 2012-10-01 MED ORDER — QUETIAPINE FUMARATE 50 MG PO TABS: 50.0000 mg | ORAL_TABLET | Freq: Every day | ORAL | Status: DC

## 2012-10-01 NOTE — Telephone Encounter (Signed)
Patient was returning nurse's call. Please assist.

## 2012-10-01 NOTE — Telephone Encounter (Signed)
Spoke with pt about labs. Pt states that he has tried Cymbalta before and it did not work for him. Per Oran Rein, pt can try Seroquel 50 mg qhs for 6 weeks.  Pt is willing to try the Seroquel and will return in 6 weeks for f/u

## 2012-10-06 ENCOUNTER — Other Ambulatory Visit: Payer: Self-pay | Admitting: Internal Medicine

## 2012-10-06 ENCOUNTER — Encounter: Payer: Self-pay | Admitting: Internal Medicine

## 2012-11-10 ENCOUNTER — Encounter: Payer: Self-pay | Admitting: Internal Medicine

## 2012-11-10 ENCOUNTER — Encounter: Payer: Self-pay | Admitting: Cardiovascular Disease

## 2012-11-10 ENCOUNTER — Ambulatory Visit (INDEPENDENT_AMBULATORY_CARE_PROVIDER_SITE_OTHER): Payer: BC Managed Care – PPO | Admitting: Cardiovascular Disease

## 2012-11-10 VITALS — BP 142/98 | HR 92 | Ht 77.5 in | Wt 352.0 lb

## 2012-11-10 DIAGNOSIS — I471 Supraventricular tachycardia: Secondary | ICD-10-CM

## 2012-11-10 DIAGNOSIS — R002 Palpitations: Secondary | ICD-10-CM | POA: Insufficient documentation

## 2012-11-10 DIAGNOSIS — I498 Other specified cardiac arrhythmias: Secondary | ICD-10-CM

## 2012-11-10 NOTE — Patient Instructions (Addendum)
Your physician has requested that you have an echocardiogram. Echocardiography is a painless test that uses sound waves to create images of your heart. It provides your doctor with information about the size and shape of your heart and how well your heart's chambers and valves are working. This procedure takes approximately one hour. There are no restrictions for this procedure.  Your physician has recommended that you wear a holter monitor. Holter monitors are medical devices that record the heart's electrical activity. Doctors most often use these monitors to diagnose arrhythmias. Arrhythmias are problems with the speed or rhythm of the heartbeat. The monitor is a small, portable device. You can wear one while you do your normal daily activities. This is usually used to diagnose what is causing palpitations/syncope (passing out).  FOLLOW UP WILL DEPEND ON RESULTS. YOU WILL BE CALLED WITH RESULTS 3-4 DAYS AFTER.     [facility] Low Glycemic Diet (Source: [facility], 2006) Low Glycemic Foods (20-49) (Decrease risk of developing heart disease) Breakfast Cereals: All-Bran All-Bran Fruit 'n Oats Fiber One Oatmeal (not instant) Oat bran Fruits and fruit juices: (Limit to 1-2 servings per day) Apples Apricots (fresh & dried) Blackberries Blueberries Cherries Cranberries Peaches Pears Plums Prunes Grapefruit Raspberries Strawberries Tangerine Apple juice Grapefruit juice Tomato juice Beans and legumes (fresh-cooked): Black-eyed peas Butter beans Chick peas Lentils  Green beans Lima beans Kidney beans Navy beans Pinto beans Snow peas Non-starchy vegetables: Asparagus, avocado, broccoli, cabbage, cauliflower, celery, cucumber, greens, lettuce, mushrooms, peppers, tomatoes, okra, onions, spinach, summer squash Grains: Barley Bulgur Rye Wild rice Nuts and oils : Almonds Peanuts Sunflower seeds Hazelnuts Pecans Walnuts Oils that are liquid at room  temperature Dairy, fish, meat, soy, and eggs: Milk, skim Lowfat cheese Yogurt, lowfat, fruit sugar sweetened Lean red meat Fish  Skinless chicken & [place] Shellfish Egg whites (up to 3 daily) Soy products  Egg yolks (up to 7 or _____ per week) Moderate Glycemic Foods (50-69) Breakfast Cereals: Bran Buds Bran Chex Just Right Mini-Wheats  Special K Swiss muesli Fruits: Banana (under-ripe) Dates Figs Grapes Kiwi Mango Oranges Raisins Fruit Juices: Cranberry juice Orange juice Beans and legumes: Boston-type baked beans Canned pinto, kidney, or navy beans Green peas Vegetables: Beets Carrots  Sweet potato Yam Corn on the cob Breads: Pita (pocket) bread Oat bran bread Pumpernickel bread Rye bread Wheat bread, high fiber  Grains: Cornmeal Rice, brown Rice, white Couscous Pasta: Macaroni Pizza, cheese Ravioli, meat filled Spaghetti, white  Nuts: Cashews Macadamia Snacks: Chocolate Ice cream, lowfat Muffin Popcorn High Glycemic Foods (70-100)  Breakfast Cereals: Cheerios Corn Chex Corn Flakes Cream of Wheat Grape Nuts Grape Nut Flakes Grits Nutri-Grain Puffed Rice Puffed Wheat Rice Chex Rice Krispies Shredded Wheat Team Total Fruits: Pineapple Watermelon Banana (over-ripe) Beverages: Sodas, sweet tea, pineapple juice Vegetables: Potato, baked, boiled, fried, mashed Jamaica fries Canned or frozen corn Parsnips Winter squash Breads: Most breads (white and whole grain) Bagels Bread sticks Bread stuffing Kaiser roll Dinner rolls Grains: Rice, instant Tapioca, with milk Candy and most cookies Snacks: Donuts Corn chips Jelly beans Pretzels Pastries

## 2012-11-10 NOTE — Assessment & Plan Note (Signed)
  I cannot rule out a primary cardiac issues - we will get an echo to look at his LV function.  We will also place a 24 hr holter monitor to evaluate his palpitations.  I had a long discussion about his issues.  I think that his palpitations are secondary to his underlying problems - untreated sleep apnea, poor diet, anxiety.  I suggest that he improved his diet. He eats a fairly high salt and high fat diet. Given him information about the low glycemic index diet. I suggested that he exercise everyday. I suggest that he get a job which should reduce his anxiety. I suggested that he effectively treat his sleep apnea. He has been diagnosed with sleep apnea in the past but has not use his CPAP mask in years. In fact he no longer even has a CPAP mask. He should consult with his medical doctor and have another sleep study performed or perhaps get evaluated for a different CPAP mask.  If his Holter monitor and echocardiogram are unremarkable I do not think that I need to see him on a regular basis. We'll see him as needed to

## 2012-11-10 NOTE — Progress Notes (Signed)
     Luis Garcia Date of Birth  [birth year]       [facility]    [place] [address]  [address] [place], [place]  [address]   [place], [place]  [address] [phone]     [phone]   Fax  [phone]    Fax [phone]  Problem List: 1. Tachypalpitations 2. Hx of SVT - s/p RF ablation 3. Morbid obesity 4. Obstructive sleep apnea  History of Present Illness:  Luis Garcia is a 32 year old gentleman with a history of SVT in the past. He is status post RF ablation.  For the past several months he's noted that he's had increasing tachypalpitations. These episodes last  anywhere from several minutes to all day long.  He's quite fatigued. He's had trouble keeping a job because of his lack of stamina and fatigue.   He is under lots of stress.  He has a generalized lack of energy.   He does not sleep well at night.  He does not wear his CPAP.    He does not get any regular exercise.  He does not follow any specific diet.      Current Outpatient Prescriptions on File Prior to Visit  Medication Sig Dispense Refill  . metoprolol succinate (TOPROL-XL) 50 MG 24 hr tablet TAKE 2&1/2 TABLETS BY MOUTH ONCE DAILY  75 tablet  2   No current facility-administered medications on file prior to visit.    Allergies  Allergen Reactions  . Codeine Rash  . Sulfa Antibiotics Rash    Past Medical History  Diagnosis Date  . ANXIETY 03/27/2007  . Atrial fibrillation 06/16/2007  . DEPRESSION 06/16/2007  . Deviated nasal septum 04/15/2007  . Morbid obesity 03/12/2008  . SLEEP APNEA 03/27/2007  . 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 Atrial fib ablation    . Dental surgery      History  Smoking status  . Former Smoker  . Quit date: 07/07/2004  Smokeless tobacco  . Not on file    Comment: stoped in 2008- smoked 1/2 ppd for 8 years    History  Alcohol Use No    Comment:  occasionally    Family History  Problem Relation Age of Onset  . Anxiety disorder    . Diabetes Mother   . Hypertension Father   . Hyperlipidemia Father     Reviw of Systems:  Reviewed in the HPI.  All other systems are negative.  Physical Exam: Blood pressure 142/98, pulse 92, height 6' 5.5" (1.969 m), weight 352 lb (159.666 kg). General: Well developed, well nourished, in no acute distress.  Morbidly obese.  Head: Normocephalic, atraumatic, sclera non-icteric, mucus membranes are moist,   Neck: Supple. Carotids are 2 + without bruits. No JVD   Lungs: Clear   Heart: RR, normal S1, S2  Abdomen: Soft, non-tender, non-distended with normal bowel sounds.  Msk:  Strength and tone are normal   Extremities: No clubbing or cyanosis. No edema.  Distal pedal pulses are 2+ and equal    Neuro: CN II - XII intact.  Alert and oriented X 3.   Psych:  Normal   ECG: 09/29/12:  NSR at 85, no ST or T wave change  Assessment / Plan:

## 2012-11-20 ENCOUNTER — Ambulatory Visit (HOSPITAL_COMMUNITY): Payer: BC Managed Care – PPO | Attending: Cardiovascular Disease

## 2012-11-20 ENCOUNTER — Encounter (INDEPENDENT_AMBULATORY_CARE_PROVIDER_SITE_OTHER): Payer: BC Managed Care – PPO

## 2012-11-20 ENCOUNTER — Encounter: Payer: Self-pay | Admitting: *Deleted

## 2012-11-20 DIAGNOSIS — I4891 Unspecified atrial fibrillation: Secondary | ICD-10-CM | POA: Insufficient documentation

## 2012-11-20 DIAGNOSIS — R002 Palpitations: Secondary | ICD-10-CM

## 2012-11-20 DIAGNOSIS — I471 Supraventricular tachycardia, unspecified: Secondary | ICD-10-CM | POA: Insufficient documentation

## 2012-11-20 DIAGNOSIS — I498 Other specified cardiac arrhythmias: Secondary | ICD-10-CM

## 2012-11-20 DIAGNOSIS — Z87891 Personal history of nicotine dependence: Secondary | ICD-10-CM | POA: Insufficient documentation

## 2012-11-20 DIAGNOSIS — G4733 Obstructive sleep apnea (adult) (pediatric): Secondary | ICD-10-CM | POA: Insufficient documentation

## 2012-11-20 NOTE — Progress Notes (Signed)
Echocardiogram performed.  

## 2012-11-20 NOTE — Progress Notes (Signed)
Patient ID: Luis Garcia, male   DOB: 10-May-1981, 32 y.o.   MRN: [id number] E-Cardio 24 Hour Holter Monitor applied to patient.

## 2012-11-24 ENCOUNTER — Telehealth: Payer: Self-pay | Admitting: *Deleted

## 2012-11-24 NOTE — Telephone Encounter (Signed)
Message copied by Antony Odea on Mon Nov 24, 2012  5:17 PM ------      Message from: Vesta Mixer      Created: Thu Nov 20, 2012  4:33 PM       Normal echo ------

## 2012-11-24 NOTE — Telephone Encounter (Signed)
Discussed left atrial enlargement. Asked him to call me with 3 home bp results/ dicussed wt lose further and pt declined further referral for pulmonology for new cpap, advised him to elevate head of bed to alleviate wt on neck/chest during sleep.  Pt agreed to plan.

## 2012-11-24 NOTE — Progress Notes (Signed)
lmtcb

## 2012-12-01 ENCOUNTER — Ambulatory Visit (INDEPENDENT_AMBULATORY_CARE_PROVIDER_SITE_OTHER): Payer: BC Managed Care – PPO | Admitting: Internal Medicine

## 2012-12-01 ENCOUNTER — Encounter: Payer: Self-pay | Admitting: Internal Medicine

## 2012-12-01 VITALS — BP 140/86 | HR 80 | Temp 98.2°F | Resp 18 | Ht 77.5 in | Wt 352.0 lb

## 2012-12-01 DIAGNOSIS — G4730 Sleep apnea, unspecified: Secondary | ICD-10-CM

## 2012-12-01 DIAGNOSIS — R7309 Other abnormal glucose: Secondary | ICD-10-CM

## 2012-12-01 DIAGNOSIS — R739 Hyperglycemia, unspecified: Secondary | ICD-10-CM

## 2012-12-01 DIAGNOSIS — G4710 Hypersomnia, unspecified: Secondary | ICD-10-CM

## 2012-12-01 MED ORDER — ESOMEPRAZOLE MAGNESIUM 40 MG PO CPDR: 40.0000 mg | DELAYED_RELEASE_CAPSULE | Freq: Every day | ORAL | Status: DC

## 2012-12-01 NOTE — Progress Notes (Signed)
  Subjective:    Patient ID: Luis Garcia, male    DOB: October 29, 1980, 32 y.o.   MRN: [id number]  HPI On a hypoglycemic diet The nexium has helped with the GERD Has lost weight per patient  Echo with dilated ventricle Dealing with stress and hopes to get on disability   Review of Systems  Constitutional: Positive for activity change and fatigue. Negative for fever.  HENT: Negative for hearing loss, congestion, neck pain and postnasal drip.   Eyes: Negative for discharge, redness and visual disturbance.  Respiratory: Positive for shortness of breath. Negative for cough and wheezing.   Cardiovascular: Positive for chest pain. Negative for leg swelling.  Gastrointestinal: Positive for abdominal pain. Negative for constipation and abdominal distention.  Genitourinary: Negative for urgency and frequency.  Musculoskeletal: Negative for joint swelling and arthralgias.  Skin: Negative for color change and rash.       tatoos  Neurological: Negative for weakness and light-headedness.  Hematological: Negative for adenopathy.  Psychiatric/Behavioral: Negative for behavioral problems.       Objective:   Physical Exam  Nursing note and vitals reviewed. Constitutional: He appears well-developed and well-nourished.  Morbid obesity  HENT:  Head: Normocephalic and atraumatic.  Eyes: Conjunctivae are normal. Pupils are equal, round, and reactive to light.  Neck: Normal range of motion. Neck supple.  Cardiovascular: Normal rate and regular rhythm.   Murmur heard. Pulmonary/Chest: Effort normal and breath sounds normal.  Abdominal: Soft. Bowel sounds are normal.          Assessment & Plan:  Morbid obesity with dilated left ventricle due to obesity and HTN Stable GERD on nexium The obesity is the main risk for CAD Sleep apnea Anxiety Possible candidate for SS disability

## 2012-12-01 NOTE — Patient Instructions (Addendum)
The patient is instructed to continue all medications as prescribed. Schedule followup with check out clerk upon leaving the clinic  

## 2012-12-02 LAB — CBC WITH DIFFERENTIAL/PLATELET
Basophils Absolute: 0.1 10*3/uL (ref 0.0–0.1)
HCT: 44.3 % (ref 39.0–52.0)
Lymphs Abs: 1.7 10*3/uL (ref 0.7–4.0)
MCV: 87.8 fl (ref 78.0–100.0)
Monocytes Absolute: 0.7 10*3/uL (ref 0.1–1.0)
Neutrophils Relative %: 63.7 % (ref 43.0–77.0)
Platelets: 316 10*3/uL (ref 150.0–400.0)
RDW: 12.9 % (ref 11.5–14.6)

## 2012-12-02 LAB — HEMOGLOBIN A1C: Hgb A1c MFr Bld: 5.4 % (ref 4.6–6.5)

## 2012-12-03 ENCOUNTER — Telehealth: Payer: Self-pay | Admitting: *Deleted

## 2012-12-03 NOTE — Telephone Encounter (Signed)
msg left/ ecardio normal, call with questions.

## 2012-12-08 ENCOUNTER — Encounter: Payer: Self-pay | Admitting: Internal Medicine

## 2012-12-10 ENCOUNTER — Other Ambulatory Visit: Payer: Self-pay | Admitting: *Deleted

## 2012-12-10 MED ORDER — OMEPRAZOLE 40 MG PO CPDR: 40.0000 mg | DELAYED_RELEASE_CAPSULE | Freq: Every day | ORAL | Status: DC

## 2013-01-09 ENCOUNTER — Encounter: Payer: Self-pay | Admitting: Pulmonary Disease

## 2013-01-09 ENCOUNTER — Ambulatory Visit (INDEPENDENT_AMBULATORY_CARE_PROVIDER_SITE_OTHER): Payer: BC Managed Care – PPO | Admitting: Pulmonary Disease

## 2013-01-09 VITALS — BP 138/84 | HR 88 | Temp 97.7°F | Ht 77.25 in | Wt 347.6 lb

## 2013-01-09 DIAGNOSIS — G4733 Obstructive sleep apnea (adult) (pediatric): Secondary | ICD-10-CM

## 2013-01-09 NOTE — Assessment & Plan Note (Signed)
The patient has a history of obstructive sleep apnea dating back to 2001, but was unable to tolerate CPAP because of the significant nasal congestion.  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  His sleep study from 2001 is unavailable currently, but we are sending for his old chart.  Once this is documented, we'll try to restart him on CPAP with a full face mask, and also work on nasal hygiene.  I have also encouraged him to work aggressively on weight loss.

## 2013-01-09 NOTE — Progress Notes (Signed)
Subjective:    Patient ID: Luis Garcia, male    DOB: 04-22-1981, 32 y.o.   MRN: [id number]  HPI The pt is a 32y/o male who I have been asked to see for management of osa.  He was first diagnosed in 2001, but his study is not available for my review.  He tells me that he was started on cpap with a nasal mask, but was unable to tolerate.  He noted significant nasal congestion, and feels he needed a full face mask.  He currently has been told that he has loud snoring, as well as witnessed apneas and arousals during the night.  He notes nonrestorative sleep, and has significant sleepiness during the day with inactivity.  He has poor sleep hygeine, with a self induced delayed sleep phase disorder.   He notes significant weight gain over the years, and his epworth score today is 5.    Sleep Questionnaire What time do you typically go to bed?( Between what hours) 3am-5am 3am-5am at 1424 on 01/09/13 by Maisie Fus, CMA How long does it take you to fall asleep? 1.5 hours 1.5 hours at 1424 on 01/09/13 by Maisie Fus, CMA How many times during the night do you wake up? 6 6 at 1424 on 01/09/13 by Maisie Fus, CMA What time do you get out of bed to start your day? 1230 1230 at 1424 on 01/09/13 by Maisie Fus, CMA Do you drive or operate heavy machinery in your occupation? No No at 1424 on 01/09/13 by Maisie Fus, CMA How much has your weight changed (up or down) over the past two years? (In pounds) 30 lb (13.608 kg)30 lb (13.608 kg) decrease at 1424 on 01/09/13 by Maisie Fus, CMA Have you ever had a sleep study before? Yes Yes at 1424 on 01/09/13 by Maisie Fus, CMA If yes, location of study? [place] at 1424 on 01/09/13 by Maisie Fus, CMA If yes, date of study? 2004? 2004? at 1424 on 01/09/13 by Maisie Fus, CMA Do you currently use CPAP? No No at 1424 on 01/09/13 by Maisie Fus, CMA Do you wear oxygen at any time? No No at 1424 on 01/09/13 by  Maisie Fus, CMA   Review of Systems  Constitutional: Negative for fever and unexpected weight change.  HENT: Negative for ear pain, nosebleeds, congestion, sore throat, rhinorrhea, sneezing, trouble swallowing, dental problem, postnasal drip and sinus pressure.   Eyes: Negative for redness and itching.  Respiratory: Negative for cough, chest tightness, shortness of breath and wheezing.   Cardiovascular: Negative for palpitations and leg swelling.  Gastrointestinal: Negative for nausea and vomiting.  Genitourinary: Negative for dysuria.  Musculoskeletal: Negative for joint swelling.  Skin: Negative for rash.  Neurological: Negative for headaches.  Hematological: Does not bruise/bleed easily.  Psychiatric/Behavioral: Negative for dysphoric mood. The patient is not nervous/anxious.        Objective:   Physical Exam Constitutional:  Obese male, no acute distress  HENT:  Nares patent without discharge, but swollen tubinates and mild septal deviation to the left  Oropharynx without exudate, palate and uvula are mildly elongated.   Eyes:  Perrla, eomi, no scleral icterus  Neck:  No JVD, no TMG  Cardiovascular:  Mildly tachycardic, regular rhythm, no rubs or gallops.  No murmurs        Intact distal pulses  Pulmonary :  Normal breath sounds, no stridor or respiratory distress   No rales, rhonchi,  or wheezing  Abdominal:  Soft, nondistended, bowel sounds present.  No tenderness noted.   Musculoskeletal:  No lower extremity edema noted.  Lymph Nodes:  No cervical lymphadenopathy noted  Skin:  No cyanosis noted  Neurologic: mildly sleepy but appropriate, moves all 4 extremities without obvious deficit.         Assessment & Plan:

## 2013-01-09 NOTE — Patient Instructions (Addendum)
Will start you back on cpap once we get your chart out of the warehouse.   Will start at a low level of pressure, and will use full face mask.  Please call me if you have issues with tolerance, and we can make adjustments. Work on weight loss. Try to get up at 7am each morning, regardless of what time you went to bed or how much sleep you have gotten.  No napping during the day.  This will get you back on schedule.  Will arrange followup once we get you back on cpap.

## 2013-01-16 ENCOUNTER — Encounter: Payer: Self-pay | Admitting: Pulmonary Disease

## 2013-01-16 ENCOUNTER — Other Ambulatory Visit: Payer: Self-pay | Admitting: Pulmonary Disease

## 2013-01-16 DIAGNOSIS — G4733 Obstructive sleep apnea (adult) (pediatric): Secondary | ICD-10-CM

## 2013-01-20 ENCOUNTER — Telehealth: Payer: Self-pay | Admitting: Pulmonary Disease

## 2013-01-20 DIAGNOSIS — G4733 Obstructive sleep apnea (adult) (pediatric): Secondary | ICD-10-CM

## 2013-01-20 NOTE — Telephone Encounter (Signed)
LM[facility] for [facility] to [facility]

## 2013-01-21 NOTE — Telephone Encounter (Signed)
Ok to change to s9 escape/auto with heated humidity and climate control tubing.

## 2013-01-21 NOTE — Telephone Encounter (Signed)
Spoke with Melissa-- [facility] only has the S9 Auto Set at this time They are looking into adding the S10 to their system but do not offer this machine at the current time.   Please advise if okay to change order to S9. Thanks.

## 2013-01-21 NOTE — Telephone Encounter (Signed)
Order place and staff message sent to [facility] with [facility]

## 2013-01-28 ENCOUNTER — Other Ambulatory Visit: Payer: Self-pay | Admitting: Internal Medicine

## 2013-03-26 ENCOUNTER — Other Ambulatory Visit: Payer: Self-pay

## 2013-04-03 ENCOUNTER — Telehealth: Payer: Self-pay | Admitting: Internal Medicine

## 2013-04-03 ENCOUNTER — Emergency Department (HOSPITAL_BASED_OUTPATIENT_CLINIC_OR_DEPARTMENT_OTHER)
Admission: EM | Admit: 2013-04-03 | Discharge: 2013-04-03 | Disposition: A | Discharge status: Home / Self Care | Payer: BC Managed Care – PPO | Attending: Emergency Medicine | Admitting: Emergency Medicine

## 2013-04-03 ENCOUNTER — Encounter (HOSPITAL_BASED_OUTPATIENT_CLINIC_OR_DEPARTMENT_OTHER): Payer: Self-pay | Admitting: Emergency Medicine

## 2013-04-03 DIAGNOSIS — F329 Major depressive disorder, single episode, unspecified: Secondary | ICD-10-CM | POA: Insufficient documentation

## 2013-04-03 DIAGNOSIS — Z87891 Personal history of nicotine dependence: Secondary | ICD-10-CM | POA: Insufficient documentation

## 2013-04-03 DIAGNOSIS — F3289 Other specified depressive episodes: Secondary | ICD-10-CM | POA: Insufficient documentation

## 2013-04-03 DIAGNOSIS — K2100 Gastro-esophageal reflux disease with esophagitis, without bleeding: Secondary | ICD-10-CM | POA: Insufficient documentation

## 2013-04-03 DIAGNOSIS — J029 Acute pharyngitis, unspecified: Secondary | ICD-10-CM | POA: Insufficient documentation

## 2013-04-03 DIAGNOSIS — I4891 Unspecified atrial fibrillation: Secondary | ICD-10-CM | POA: Insufficient documentation

## 2013-04-03 DIAGNOSIS — F411 Generalized anxiety disorder: Secondary | ICD-10-CM | POA: Insufficient documentation

## 2013-04-03 DIAGNOSIS — G4730 Sleep apnea, unspecified: Secondary | ICD-10-CM | POA: Insufficient documentation

## 2013-04-03 DIAGNOSIS — K219 Gastro-esophageal reflux disease without esophagitis: Secondary | ICD-10-CM

## 2013-04-03 DIAGNOSIS — Z79899 Other long term (current) drug therapy: Secondary | ICD-10-CM | POA: Insufficient documentation

## 2013-04-03 DIAGNOSIS — R1310 Dysphagia, unspecified: Secondary | ICD-10-CM | POA: Insufficient documentation

## 2013-04-03 HISTORY — DX: Hypoglycemia, unspecified: E16.2

## 2013-04-03 MED ORDER — GI COCKTAIL ~~LOC~~
30.0000 mL | Freq: Once | ORAL | Status: AC
  Administered 2013-04-03: 30 mL via ORAL
  Filled 2013-04-03: qty 30

## 2013-04-03 MED ORDER — PANTOPRAZOLE SODIUM 20 MG PO TBEC: 20.0000 mg | DELAYED_RELEASE_TABLET | Freq: Every day | ORAL | Status: DC

## 2013-04-03 NOTE — Telephone Encounter (Signed)
Patient Information:  Caller Name: Heyden  Phone: [phone]  Patient: Luis Garcia, Luis Garcia  Gender: Male  DOB: [birth year]  Age: 32 Years  PCP: Darryll Capers (Adults only)  Office Follow Up:  Does the office need to follow up with this patient?: Yes  Instructions For The Office: Please review and contact patient at  [phone].   Symptoms  Reason For Call & Symptoms: Has had drainage down back of throat. Then problems swallowing since yesterday 11/13.  Seen in ER today, just came home after evaluation.  Trying to get Ensure down, managing small sips at intervals only.  Sometimes swallowing saliva, sometimes spitting out saliva.  Told to call PMD to get referral to gastroenterologist so trying to accomplish this.  Reviewed Health History In EMR: Yes  Reviewed Medications In EMR: Yes  Reviewed Allergies In EMR: Yes  Reviewed Surgeries / Procedures: Yes  Date of Onset of Symptoms: 04/02/2013  Guideline(s) Used:  No Protocol Available - Sick Adult  Disposition Per Guideline:   Discuss with PCP and Callback by Nurse within 1 Hour  Reason For Disposition Reached:   Nursing judgment  Advice Given:  Call Back If:  New symptoms develop  You become worse.  Patient Will Follow Care Advice:  YES

## 2013-04-03 NOTE — ED Notes (Signed)
Pt states throat slightly sore since yesterday morning;  Pt has induced vomiting since yesterday morning because feels like "nothing will go down".  Pt states unable to swallow liquids

## 2013-04-03 NOTE — Telephone Encounter (Signed)
Per ED note refer to [facility], referral order placed

## 2013-04-03 NOTE — ED Provider Notes (Signed)
CSN: [id number]     Arrival date & time 04/03/13  4098 History   First MD Initiated Contact with Patient 04/03/13 0913     Chief Complaint  Patient presents with  . Sore Throat  . Dysphagia   (Consider location/radiation/quality/duration/timing/severity/associated sxs/prior Treatment) HPI Comments: Patient is a 32 year old male with history of atrial fibrillation and anxiety. He presents today with complaints of difficulty swallowing. States that he has" unable to keep anything down" since yesterday. States that whenever he swallows comes right back up. He also states that he is having reflux of bile. He denies difficulty breathing. No fevers or chills.  Patient is a 32 y.o. male presenting with pharyngitis. The history is provided by the patient.  Sore Throat This is a new problem. The current episode started yesterday. The problem occurs constantly. The problem has been gradually worsening. The symptoms are aggravated by swallowing. Nothing relieves the symptoms. He has tried nothing for the symptoms.    Past Medical History  Diagnosis Date  . ANXIETY 03/27/2007  . Atrial fibrillation 06/16/2007  . DEPRESSION 06/16/2007  . Deviated nasal septum 04/15/2007  . Morbid obesity 03/12/2008  . SLEEP APNEA 03/27/2007  . 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 Hypoglycemia    Past Surgical History  Procedure Laterality Date  . Atrial fib ablation    . Dental surgery    . Cholecystectomy     Family History  Problem Relation Age of Onset  . Anxiety disorder    . Diabetes Mother   . Hypertension Father   . Hyperlipidemia Father    History  Substance Use Topics  . Smoking status: Former Smoker -- 0.50 packs/day for 8 years    Types: Cigarettes    Quit date: 07/07/2004  . Smokeless tobacco: Not on file     Comment: stopped in 2006- smoked 1/2 ppd for 8 years  . Alcohol Use: No    Review of Systems  All other systems reviewed and are negative.    Allergies  Codeine and Sulfa  antibiotics  Home Medications   Current Outpatient Rx  Name  Route  Sig  Dispense  Refill  . clonazePAM (KLONOPIN) 0.5 MG tablet   Oral   Take 0.5 mg by mouth 2 (two) times daily as needed for anxiety.         . ALPRAZolam (XANAX XR) 0.5 MG 24 hr tablet   Oral   Take 0.5 mg by mouth as needed.         . metoprolol succinate (TOPROL-XL) 50 MG 24 hr tablet      TAKE 2&1/2 TABLETS BY MOUTH ONCE DAILY   75 tablet   10   . Nutritional Supplements (COMPLETE PROTEIN/VITAMIN SHAKE PO)   Oral   Take by mouth daily.         . pantoprazole (PROTONIX) 40 MG tablet   Oral   Take 40 mg by mouth daily.          BP 127/85  Pulse 100  Temp(Src) 98.7 F (37.1 C) (Oral)  Resp 18  Ht 6' 5.5" (1.969 m)  Wt 340 lb (154.223 kg)  BMI 39.78 kg/m2  SpO2 98% Physical Exam  Nursing note and vitals reviewed. Constitutional: He is oriented to person, place, and time. He appears well-developed and well-nourished. No distress.  HENT:  Head: Normocephalic and atraumatic.  Mouth/Throat: Oropharynx is clear and moist.  Neck: Normal range of motion. Neck supple.  Cardiovascular: Normal rate, regular rhythm and normal heart sounds.  No murmur heard. Pulmonary/Chest: Effort normal and breath sounds normal. No respiratory distress. He has no wheezes.  Abdominal: Soft. Bowel sounds are normal. He exhibits no distension. There is no tenderness.  Musculoskeletal: Normal range of motion. He exhibits no edema.  Neurological: He is alert and oriented to person, place, and time.  Skin: Skin is warm and dry. He is not diaphoretic.    ED Course  Procedures (including critical care time) Labs Review Labs Reviewed - No data to display Imaging Review No results found.    MDM  No diagnosis found. Patient is a 32 year old male presents with difficulty swallowing since yesterday. He states that he has a history of reflux and esophagitis but has been off of his Nexium for many months. In the  emergency department he is able to drink water and feel it enters stomach without any difficulty. He was given a GI cocktail which seemed to help. There is no evidence for obstruction or esophageal impaction. He will be discharged to home with prescription for protonix and when necessary followup with gastroenterology.    Geoffery Lyons, MD 04/03/13 [phone]

## 2013-04-08 ENCOUNTER — Other Ambulatory Visit: Payer: Self-pay | Admitting: Gastroenterology

## 2013-04-08 DIAGNOSIS — R1310 Dysphagia, unspecified: Secondary | ICD-10-CM

## 2013-04-10 ENCOUNTER — Ambulatory Visit
Admission: RE | Admit: 2013-04-10 | Discharge: 2013-04-10 | Disposition: A | Discharge status: Home / Self Care | Payer: Medicaid Other | Source: Ambulatory Visit | Attending: Gastroenterology | Admitting: Gastroenterology

## 2013-04-10 DIAGNOSIS — R1310 Dysphagia, unspecified: Secondary | ICD-10-CM

## 2013-04-13 ENCOUNTER — Encounter (HOSPITAL_COMMUNITY): Payer: Self-pay | Admitting: *Deleted

## 2013-04-15 ENCOUNTER — Encounter (HOSPITAL_COMMUNITY): Payer: Self-pay | Admitting: Pharmacy Technician

## 2013-04-29 ENCOUNTER — Ambulatory Visit (HOSPITAL_COMMUNITY)
Admission: RE | Admit: 2013-04-29 | Payer: BC Managed Care – PPO | Source: Ambulatory Visit | Admitting: Gastroenterology

## 2013-04-29 HISTORY — DX: Essential (primary) hypertension: I10

## 2013-04-29 HISTORY — DX: Other complications of anesthesia, initial encounter: T88.59XA

## 2013-04-29 HISTORY — DX: Adverse effect of unspecified anesthetic, initial encounter: T41.45XA

## 2013-04-29 HISTORY — DX: Gastro-esophageal reflux disease without esophagitis: K21.9

## 2013-04-29 SURGERY — ESOPHAGOGASTRODUODENOSCOPY (EGD) WITH PROPOFOL
Anesthesia: Monitor Anesthesia Care

## 2013-06-08 ENCOUNTER — Telehealth: Payer: Self-pay | Admitting: Internal Medicine

## 2013-06-08 NOTE — Telephone Encounter (Signed)
[facility] [address] requesting new script for pantoprazole (PROTONIX) 40 MG tablet #30 1qd, last filled 05/05/13

## 2013-06-09 ENCOUNTER — Other Ambulatory Visit: Payer: Self-pay | Admitting: *Deleted

## 2013-06-09 MED ORDER — PANTOPRAZOLE SODIUM 20 MG PO TBEC: 20.0000 mg | DELAYED_RELEASE_TABLET | Freq: Every day | ORAL | Status: DC

## 2013-11-30 ENCOUNTER — Encounter: Payer: Self-pay | Admitting: Family Medicine

## 2013-11-30 ENCOUNTER — Ambulatory Visit (INDEPENDENT_AMBULATORY_CARE_PROVIDER_SITE_OTHER): Payer: Medicaid Other | Admitting: Family Medicine

## 2013-11-30 DIAGNOSIS — G4733 Obstructive sleep apnea (adult) (pediatric): Secondary | ICD-10-CM

## 2013-11-30 DIAGNOSIS — F411 Generalized anxiety disorder: Secondary | ICD-10-CM

## 2013-11-30 DIAGNOSIS — I498 Other specified cardiac arrhythmias: Secondary | ICD-10-CM

## 2013-11-30 DIAGNOSIS — R21 Rash and other nonspecific skin eruption: Secondary | ICD-10-CM

## 2013-11-30 MED ORDER — TRIAMCINOLONE ACETONIDE 0.1 % EX OINT: 1.0000 "application " | TOPICAL_OINTMENT | Freq: Two times a day (BID) | CUTANEOUS | Status: DC

## 2013-11-30 NOTE — Progress Notes (Signed)
   Subjective:    Patient ID: Luis Garcia, male    DOB: 11/10/1980, 33 y.o.   MRN: [id number]  HPI New to establish.  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  Will have intermittent palpitations.  Seeing Dr Luis Garcia.  On Metoprolol BID.  Denies CP, SOB, HAs.  Anxiety/depression- dx'd at age 89.  Seeing [facility]- Luis Garcia (psychologist), Luis Garcia for med management.  On Hydroxyzine QID, Effexor 75mg  TID.  Is currently on Medicaid for this.  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  OSA- chronic problem, seeing Dr Luis Garcia for this.  Not using CPAP regularly.  Complains of excessive fatigue.  Pt reports he is 'pushing' himself 'to do more stuff'.  Obesity- pt reports trying to eat right but 'that costs money that i don't have'.  Minimal exercise b/c pt has severe anxiety due to previous palpitations during exercise.  L lower leg rash- pt reports it's been present 'my whole life'.  + itching.  'i wonder if it's psoriasis like my daddy'.  Review of Systems For ROS see HPI     Objective:   Physical Exam  Vitals reviewed. Constitutional: He is oriented to person, place, and time. He appears well-developed and well-nourished. No distress.  obese  HENT:  Head: Normocephalic and atraumatic.  Eyes: Conjunctivae and EOM are normal. Pupils are equal, round, and reactive to light.  Neck: Normal range of motion. Neck supple. No thyromegaly present.  Cardiovascular: Normal rate, regular rhythm, normal heart sounds and intact distal pulses.   No murmur heard. Pulmonary/Chest: Effort normal and breath sounds normal. No respiratory distress.  Abdominal: Soft. Bowel sounds are normal. He exhibits no distension.  Musculoskeletal: He exhibits no edema.  Lymphadenopathy:    He has no cervical adenopathy.  Neurological: He is alert and oriented to person, place, and time. No cranial nerve deficit.  Skin: Skin is warm and dry.  Scaly, nearly plaque like rash on L  anterior lower leg  Psychiatric: He has a normal mood and affect. His behavior is normal.          Assessment & Plan:

## 2013-11-30 NOTE — Progress Notes (Signed)
Pre visit review using our clinic review tool, if applicable. No additional management support is needed unless otherwise documented below in the visit note. 

## 2013-11-30 NOTE — Patient Instructions (Signed)
Follow up in 3 months to recheck weight loss progress We'll notify you of your lab results and make any changes if needed Try and work on your phobia of exercise Try and make healthy food choices Use the Triamcinolone ointment twice daily on the lower leg Call with any questions or concerns We'll work on this together!  Hang in there!!!

## 2013-12-01 LAB — HEPATIC FUNCTION PANEL
ALT: 31 U/L (ref 0–53)
AST: 21 U/L (ref 0–37)
Albumin: 4.2 g/dL (ref 3.5–5.2)
Alkaline Phosphatase: 71 U/L (ref 39–117)
BILIRUBIN TOTAL: 0.3 mg/dL (ref 0.2–1.2)
Bilirubin, Direct: 0.1 mg/dL (ref 0.0–0.3)
Total Protein: 8.1 g/dL (ref 6.0–8.3)

## 2013-12-01 LAB — LIPID PANEL
CHOL/HDL RATIO: 5
Cholesterol: 158 mg/dL (ref 0–200)
HDL: 29.9 mg/dL — AB (ref >39.00)
NONHDL: 128.1
VLDL: 97 mg/dL — AB (ref 0.0–40.0)

## 2013-12-01 LAB — BASIC METABOLIC PANEL
BUN: 10 mg/dL (ref 6–23)
CALCIUM: 9.5 mg/dL (ref 8.4–10.5)
CO2: 28 mEq/L (ref 19–32)
Chloride: 102 mEq/L (ref 96–112)
Creatinine, Ser: 0.8 mg/dL (ref 0.4–1.5)
GFR: 120.75 mL/min (ref >60.00)
Glucose, Bld: 79 mg/dL (ref 70–99)
Potassium: 4.4 mEq/L (ref 3.5–5.1)
Sodium: 139 mEq/L (ref 135–145)

## 2013-12-01 LAB — TSH: TSH: 0.8 u[IU]/mL (ref 0.35–4.50)

## 2013-12-01 LAB — HEMOGLOBIN A1C: Hgb A1c MFr Bld: 5.5 % (ref 4.6–6.5)

## 2013-12-01 NOTE — Assessment & Plan Note (Signed)
New to provider, ongoing for pt.  Seeing Dr Shelle Ironlance.  Admits to not wearing mask regularly.  Stressed need to do so to improve fatigue and possibly facilitate weight loss.  Will follow.

## 2013-12-01 NOTE — Assessment & Plan Note (Signed)
New to provider.  Currently well controlled s/p ablation and w/ beta blocker.

## 2013-12-01 NOTE — Assessment & Plan Note (Signed)
New to provider, ongoing for pt.  Severe.  Pt had agoraphobia x2 yrs and was unable to leave house- following w/ both psych and therapist

## 2013-12-01 NOTE — Assessment & Plan Note (Signed)
Chronic problem.  Pt states he's unable to exercise due to anxiety and difficulty w/ leaving home.  Stressed need for healthy eating.  Pt reports that's too expensive.  Will refer to nutrition for evaluation and ongoing assistance.

## 2013-12-01 NOTE — Assessment & Plan Note (Signed)
New.  Appears to be mild psoriasis or eczema.  Start topical steroid ointment.  Will follow.

## 2013-12-02 ENCOUNTER — Other Ambulatory Visit: Payer: Self-pay | Admitting: General Practice

## 2013-12-02 MED ORDER — FENOFIBRATE 160 MG PO TABS: 160.0000 mg | ORAL_TABLET | Freq: Every day | ORAL | Status: DC

## 2013-12-08 ENCOUNTER — Telehealth: Payer: Self-pay | Admitting: General Practice

## 2013-12-08 NOTE — Telephone Encounter (Signed)
Began a Prior authorization for Pt's Fenofibrate. Called [phone] and spoke with Crystal. She advised that there is a 24 hour turn around time, so I will have to call back tomorrow to find out if approved. Confirmation #[id number] P.

## 2013-12-10 MED ORDER — FENOFIBRATE 160 MG PO TABS: 160.0000 mg | ORAL_TABLET | Freq: Every day | ORAL | Status: DC

## 2013-12-10 NOTE — Telephone Encounter (Signed)
Med approved from 12-08-13 to 12-03-14 approval notice #[id number]. Medication refilled and pt notified.

## 2013-12-21 ENCOUNTER — Other Ambulatory Visit: Payer: Self-pay | Admitting: General Practice

## 2013-12-21 ENCOUNTER — Telehealth: Payer: Self-pay | Admitting: Family Medicine

## 2013-12-21 MED ORDER — METOPROLOL TARTRATE 50 MG PO TABS: 125.0000 mg | ORAL_TABLET | Freq: Every day | ORAL | Status: DC

## 2013-12-21 MED ORDER — METOPROLOL SUCCINATE ER 50 MG PO TB24: ORAL_TABLET | ORAL | Status: DC

## 2013-12-21 NOTE — Telephone Encounter (Signed)
Caller name: Jill AlexandersJustin  [facility] [address]  Reason for call:   Pt's pharmacy is sending over a request for metoprolol succinate (TOPROL-XL) 50 MG 24 hr tablet [[id number]][[id number]] he needs a refill on this.

## 2013-12-21 NOTE — Telephone Encounter (Signed)
Refill request received from [facility]:  metoprolol succinate (TOPROL-XL) 50 MG 24 hr tablet-- Take two and one-half tablets by mouth daily.   Last Filled: Historical Med Last OV:  11/30/13 Labs:  11/30/13  Please advise.

## 2013-12-21 NOTE — Telephone Encounter (Signed)
Med filled.  

## 2013-12-21 NOTE — Telephone Encounter (Signed)
Noted will look for it

## 2014-01-19 ENCOUNTER — Encounter: Payer: Self-pay | Admitting: Dietician

## 2014-01-19 ENCOUNTER — Encounter: Payer: Medicaid Other | Attending: Family Medicine | Admitting: Dietician

## 2014-01-19 DIAGNOSIS — Z713 Dietary counseling and surveillance: Secondary | ICD-10-CM | POA: Insufficient documentation

## 2014-01-19 DIAGNOSIS — Z6841 Body Mass Index (BMI) 40.0 and over, adult: Secondary | ICD-10-CM | POA: Insufficient documentation

## 2014-01-19 DIAGNOSIS — E669 Obesity, unspecified: Secondary | ICD-10-CM | POA: Insufficient documentation

## 2014-01-19 NOTE — Progress Notes (Signed)
  Medical Nutrition Therapy:  Appt start time: 1115 end time:  1215.   Assessment:  Primary concerns today: Luis Garcia is here today with his father stating he wants to learn how to eat healthy. He is on SNAP benefits. He is working on getting approved for disability. Luis Garcia is currently unemployed and admits that he snacks all day because he has nothing to do. Luis Garcia is low due to heart condition: "had heartbeat corrected." He tries to walk but exercise makes him nervous because of his heart. 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 He has trouble swallowing and plans to have esophagus dilated. He lives with a roommate. In the past Luis Garcia lost 85 pounds but gained it back.   Preferred Learning Style:   No preference indicated   Learning Readiness:   Contemplating  Ready  MEDICATIONS: see list   DIETARY INTAKE:  Has an erratic eating pattern.  Eats frozen pizza, chicken nuggets, shredded wheat with strawberries, strawberries, bananas, pineapple, watermelon, frozen [employer] meals, salads. Snacks on Cheese Nips, Ranch flavored rice cakes, and chocolate covered pretzels.  Beverages: water with flavoring, 1% milk, sweet tea, sodas occasionally, no alcohol  Usual physical activity: none  Estimated energy needs: 2000-2200 calories 225-248 g carbohydrates 150-165 g protein 56-61 g fat  Progress Towards Goal(s):  In progress.   Nutritional Diagnosis:  [place]-3.3 Overweight/obesity As related to physical inactivity, excessive energy intake, and inappropriate food choices.  As evidenced by BMI 42.    Intervention:  Nutrition counseling. Discussed reducing dietary sugar to lower triglycerides.  Goals: -Develop a routine -Avoid skipping meals  -Have something to eat every 3-5 hours  Healthy foods to buy at the grocery store:  -Frozen vegetables, frozen fruits, eggs, frozen chicken breast, yogurt, cottage cheese, deli chicken or [place], tunafish  (with light  mayonnaise and relish), beans with [place] dressing  -Practice some basic cooking techniques (grilling, sauteeing, making tuna salad)  -Make enough for leftovers  -Pre-portion snacks  -Limit sugary beverages  -Have sugar flavoring in water  -Add lemon or lime to water  -Unsweet tea with Splenda  Samples provided and patient educated on proper use:  PB2 (qty 5) Lot#: [id number] Exp: 12/2014  PB2 (chocolate - qty 5) Lot#: None provided Exp: 03/2015  Teaching Method Utilized:  Visual Auditory  Handouts given during visit include:  15g CHO + protein snacks  MyPlate  Barriers to learning/adherence to lifestyle change: knowledge deficit, anxiety, lack of routine  Demonstrated degree of understanding via:  Teach Back   Monitoring/Evaluation:  Dietary intake, exercise, and body weight in 3 month(s).

## 2014-01-19 NOTE — Patient Instructions (Addendum)
-  Develop a routine -Avoid skipping meals  -Have something to eat every 3-5 hours  Healthy foods to buy at the grocery store:  -Frozen vegetables, frozen fruits, eggs, frozen chicken breast, yogurt, cottage cheese, deli chicken or [place], tunafish (with light  mayonnaise and relish), beans with [place] dressing  -Practice some basic cooking techniques (grilling, sauteeing, making tuna salad)  -Make enough for leftovers  -Pre-portion snacks  -Limit sugary beverages  -Have sugar flavoring in water  -Add lemon or lime to water  -Unsweet tea with Splenda

## 2014-03-03 ENCOUNTER — Encounter: Payer: Self-pay | Admitting: Family Medicine

## 2014-03-03 ENCOUNTER — Ambulatory Visit (INDEPENDENT_AMBULATORY_CARE_PROVIDER_SITE_OTHER): Payer: Self-pay | Admitting: Family Medicine

## 2014-03-03 VITALS — BP 110/80 | HR 81 | Temp 98.1°F | Resp 16 | Wt 353.2 lb

## 2014-03-03 DIAGNOSIS — F411 Generalized anxiety disorder: Secondary | ICD-10-CM | POA: Diagnosis not present

## 2014-03-03 DIAGNOSIS — E781 Pure hyperglyceridemia: Secondary | ICD-10-CM | POA: Diagnosis not present

## 2014-03-03 NOTE — Patient Instructions (Signed)
Call your insurance company and see if you can get your insurance switched to 'open access' If not, schedule an appt w/ [facility] so that you can get your triglycerides rechecked Continue to make healthy food choices and try and get regular exercise Call with any questions or concerns You can do this!

## 2014-03-03 NOTE — Progress Notes (Signed)
Pre visit review using our clinic review tool, if applicable. No additional management support is needed unless otherwise documented below in the visit note. 

## 2014-03-03 NOTE — Progress Notes (Signed)
   Subjective:    Patient ID: Luis Garcia, male    DOB: 1980/11/11, 33 y.o.   MRN: [id number]  HPI Obesity- pt has lost 5 lbs b/c he went up to 358.  Pt reports he is drinking mostly water (drinking gatorade in office).  Has eliminated sweets.  Not exercising.  Pt reports 'i eat a lot of meat'.  Has worked on [employer] M[employer]AGEeliminating snack foods.  Working on decreasing portions.  Saw Nutrition beginning of Sept and reports he has f/u pending.  Hypertriglyceridemia- noted on last labs.  Started on Fenofibrate.  No abd pain, N/V, myalgias.  Anxiety- chronic problem, on Buspirone BID.  Stopped hydroxyzine.  Following w/ [facility].   Review of Systems For ROS see HPI     Objective:   Physical Exam  Vitals reviewed. Constitutional: He is oriented to person, place, and time. He appears well-developed and well-nourished. No distress.  HENT:  Head: Normocephalic and atraumatic.  Eyes: Conjunctivae and EOM are normal. Pupils are equal, round, and reactive to light.  Neck: Normal range of motion. Neck supple. No thyromegaly present.  Cardiovascular: Normal rate, regular rhythm, normal heart sounds and intact distal pulses.   No murmur heard. Pulmonary/Chest: Effort normal and breath sounds normal. No respiratory distress.  Abdominal: Soft. Bowel sounds are normal. He exhibits no distension.  Musculoskeletal: He exhibits no edema.  Lymphadenopathy:    He has no cervical adenopathy.  Neurological: He is alert and oriented to person, place, and time. No cranial nerve deficit.  Skin: Skin is warm and dry.  Psychiatric: He has a normal mood and affect. His behavior is normal.          Assessment & Plan:

## 2014-03-07 DIAGNOSIS — E781 Pure hyperglyceridemia: Secondary | ICD-10-CM | POA: Insufficient documentation

## 2014-03-07 NOTE — Assessment & Plan Note (Signed)
Pt has seen nutrition and has started to make changes to diet.  Down 5 lbs from last visit.  Will continue to follow.

## 2014-03-07 NOTE — Assessment & Plan Note (Signed)
Pt is taking fenofibrate daily but would prefer to hold off on lab testing today due to an insurance issue.  Will follow at future visits.

## 2014-03-07 NOTE — Assessment & Plan Note (Signed)
Ongoing for pt.  Continues to have meds adjusted by psych.  Anxiety is keeping him from exercising (per pt report).  Encouraged him to continue to work on this.  Pt expressed understanding and is in agreement w/ plan.

## 2014-04-20 ENCOUNTER — Ambulatory Visit: Payer: Medicaid Other | Admitting: Dietician

## 2014-10-07 ENCOUNTER — Encounter (HOSPITAL_BASED_OUTPATIENT_CLINIC_OR_DEPARTMENT_OTHER): Payer: Self-pay | Admitting: *Deleted

## 2014-10-07 ENCOUNTER — Emergency Department (HOSPITAL_BASED_OUTPATIENT_CLINIC_OR_DEPARTMENT_OTHER)
Admission: EM | Admit: 2014-10-07 | Discharge: 2014-10-07 | Disposition: A | Discharge status: Home / Self Care | Payer: Medicaid Other | Attending: Emergency Medicine | Admitting: Emergency Medicine

## 2014-10-07 DIAGNOSIS — F419 Anxiety disorder, unspecified: Secondary | ICD-10-CM | POA: Insufficient documentation

## 2014-10-07 DIAGNOSIS — Z87891 Personal history of nicotine dependence: Secondary | ICD-10-CM | POA: Insufficient documentation

## 2014-10-07 DIAGNOSIS — F329 Major depressive disorder, single episode, unspecified: Secondary | ICD-10-CM | POA: Diagnosis not present

## 2014-10-07 DIAGNOSIS — Z79899 Other long term (current) drug therapy: Secondary | ICD-10-CM | POA: Diagnosis not present

## 2014-10-07 DIAGNOSIS — K219 Gastro-esophageal reflux disease without esophagitis: Secondary | ICD-10-CM | POA: Diagnosis not present

## 2014-10-07 DIAGNOSIS — Z8709 Personal history of other diseases of the respiratory system: Secondary | ICD-10-CM | POA: Insufficient documentation

## 2014-10-07 DIAGNOSIS — Z7952 Long term (current) use of systemic steroids: Secondary | ICD-10-CM | POA: Diagnosis not present

## 2014-10-07 DIAGNOSIS — I10 Essential (primary) hypertension: Secondary | ICD-10-CM | POA: Insufficient documentation

## 2014-10-07 NOTE — Discharge Instructions (Signed)
Follow-up with your regular Dr. for additional blood pressure checks. Continue take your current medications. Return for any new or worse symptoms which would include stroke symptoms severe headache chest pain severe shortness of breath.

## 2014-10-07 NOTE — ED Notes (Signed)
States he was at his psychiatrist office and his BP was 190/120. He was told to come here. It made him feel nervous. No complaints.

## 2014-10-07 NOTE — ED Provider Notes (Signed)
CSN: [id number]     Arrival date & time 10/07/14  1607 History   First MD Initiated Contact with Patient 10/07/14 1610     Chief Complaint  Patient presents with  . Hypertension     (Consider location/radiation/quality/duration/timing/severity/associated sxs/prior Treatment) Patient is a 34 y.o. male presenting with hypertension. The history is provided by the patient.  Hypertension Pertinent negatives include no chest pain, no abdominal pain, no headaches and no shortness of breath.   patient referred in from his's behavior health providers office for a blood pressure 190/120. Patient with asymptomatic. Patient admitted to feeling nervous while in the office. Patient denies any chest pain shortness of breath headache stroke symptoms or chest pain.  Past Medical History  Diagnosis Date  . ANXIETY 03/27/2007  . Atrial fibrillation 06/16/2007  . DEPRESSION 06/16/2007  . Deviated nasal septum 04/15/2007  . Morbid obesity 03/12/2008  . SLEEP APNEA 03/27/2007  . 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 Hypoglycemia   . Hypertension   . GERD (gastroesophageal reflux disease)   . Complication of anesthesia     was told he has small airway   Past Surgical History  Procedure Laterality Date  . Atrial fib ablation    . Dental surgery    . Cholecystectomy     Family History  Problem Relation Age of Onset  . Anxiety disorder    . Diabetes Mother   . Hypertension Father   . Hyperlipidemia Father    History  Substance Use Topics  . Smoking status: Former Smoker -- 0.50 packs/day for 8 years    Types: Cigarettes    Quit date: 07/07/2004  . Smokeless tobacco: Not on file     Comment: stopped in 2006- smoked 1/2 ppd for 8 years  . Alcohol Use: No    Review of Systems  Constitutional: Negative for diaphoresis.  HENT: Negative for congestion.   Eyes: Negative for redness.  Respiratory: Negative for shortness of breath.   Cardiovascular: Negative for chest pain.  Gastrointestinal:  Negative for nausea, vomiting and abdominal pain.  Genitourinary: Negative for hematuria.  Musculoskeletal: Negative for back pain and neck pain.  Skin: Negative for rash.  Neurological: Negative for dizziness, speech difficulty, weakness, numbness and headaches.  Hematological: Does not bruise/bleed easily.  Psychiatric/Behavioral: Negative for confusion. The patient is nervous/anxious.       Allergies  Codeine and Sulfa antibiotics  Home Medications   Prior to Admission medications   Medication Sig Start Date End Date Taking? Authorizing Provider  ALPRAZolam (XANAX XR) 0.5 MG 24 hr tablet Take 0.5 mg by mouth as needed for anxiety or sleep.     Historical Provider, MD  clonazePAM (KLONOPIN) 0.5 MG tablet Take 0.5 mg by mouth 2 (two) times daily as needed for anxiety.    Historical Provider, MD  escitalopram (LEXAPRO) 10 MG tablet Take 10 mg by mouth daily with breakfast.    Historical Provider, MD  fenofibrate 160 MG tablet Take 1 tablet (160 mg total) by mouth daily. 12/10/13   Sheliah HatchKatherine E Tabori, MD  metoprolol succinate (TOPROL XL) 50 MG 24 hr tablet Take 2.5 tablets by mouth daily. Take with or immediately following a meal. 12/21/13   Sheliah HatchKatherine E Tabori, MD  pantoprazole (PROTONIX) 20 MG tablet Take 1 tablet (20 mg total) by mouth daily. 06/09/13   Stacie GlazeJohn E Jenkins, MD  triamcinolone ointment (KENALOG) 0.1 % Apply 1 application topically 2 (two) times daily. 11/30/13   Sheliah HatchKatherine E Tabori, MD  venlafaxine XR (EFFEXOR-XR) 150  MG 24 hr capsule Take 300 mg by mouth daily with breakfast.    Historical Provider, MD   BP 154/97 mmHg  Pulse 102  Temp(Src) 98.5 F (36.9 C) (Oral)  Resp 18  Ht 6' 5.5" (1.969 m)  Wt 349 lb (158.305 kg)  BMI 40.83 kg/m2  SpO2 100% Physical Exam  Constitutional: He is oriented to person, place, and time. He appears well-developed and well-nourished. No distress.  HENT:  Head: Normocephalic and atraumatic.  Mouth/Throat: Oropharynx is clear and moist.   Eyes: Conjunctivae and EOM are normal. Pupils are equal, round, and reactive to light.  Neck: Normal range of motion. Neck supple.  Cardiovascular: Normal rate, regular rhythm and intact distal pulses.   No murmur heard. Pulmonary/Chest: Effort normal and breath sounds normal. No respiratory distress.  Abdominal: Soft. Bowel sounds are normal. There is no tenderness.  Musculoskeletal: Normal range of motion. He exhibits no edema.  Neurological: He is alert and oriented to person, place, and time. No cranial nerve deficit. He exhibits normal muscle tone. Coordination normal.  Skin: Skin is warm. No rash noted.  Nursing note and vitals reviewed.   ED Course  Procedures (including critical care time) Labs Review Labs Reviewed - No data to display  Imaging Review No results found.   EKG Interpretation None      MDM   Final diagnoses:  Essential hypertension   Patient referred in from behavioral health practice for blood pressure of 190/120. Patient completely asymptomatic. Patient's blood pressure upon arrival here was 154/97. No reason for acute intervention. Patient has no current symptoms. Patient has primary care doctor to follow-up with for additional checks of blood pressure. Patient is already on a beta blocker he states this is for his atrial fibrillation. Denies having history of hypertension however it is listed on his problem list. Patient will return for any new or worse symptoms.    Vanetta MuldersScott Krisanne Lich, MD 10/07/14 [phone]

## 2015-05-23 IMAGING — RF DG ESOPHAGUS
14 series · 14 of 14 positions shown · non-contrast
Comparison: None.

CLINICAL DATA: Dysphagia.

EXAM:
ESOPHOGRAM/BARIUM SWALLOW
TECHNIQUE: Combined double contrast and single contrast examination performed
using effervescent crystals, thick barium liquid, and thin barium
liquid.

[Series 1: run · 1 of 1 slices shown (1 of 14)]
[im 1/1]
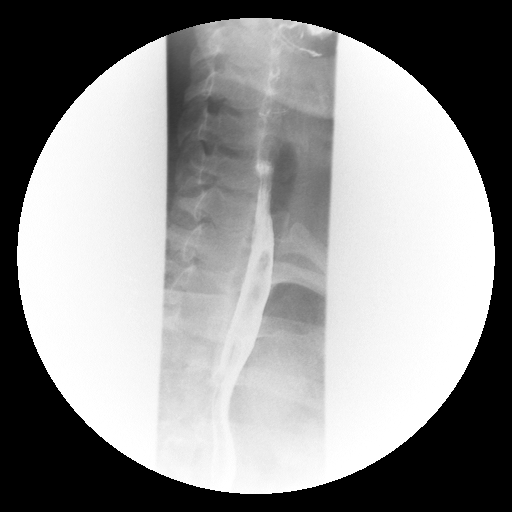

[Series 2: run · 1 of 1 slices shown (2 of 14)]
[im 1/1]
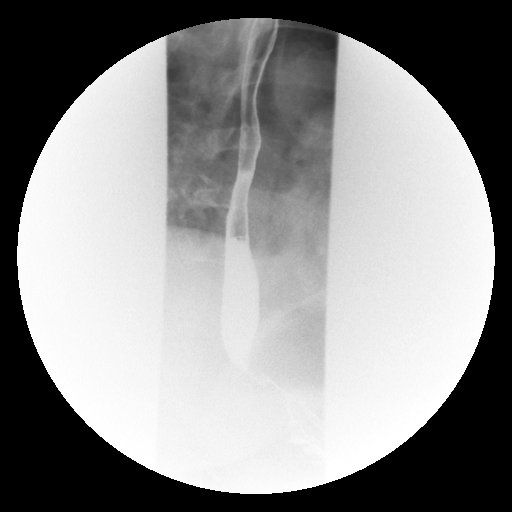

[Series 3: run · 1 of 1 slices shown (3 of 14)]
[im 1/1]
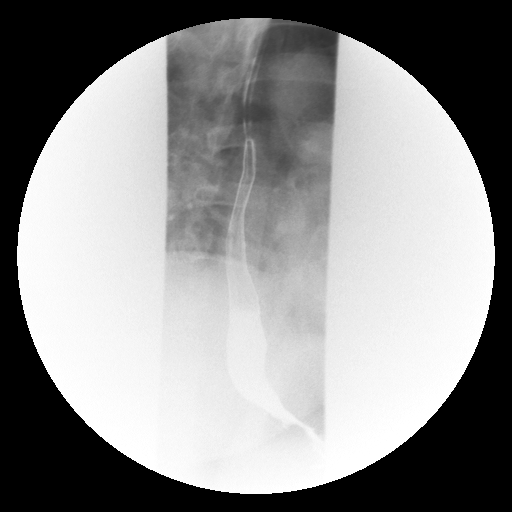

[Series 4: run · 1 of 1 slices shown (4 of 14)]
[im 1/1]
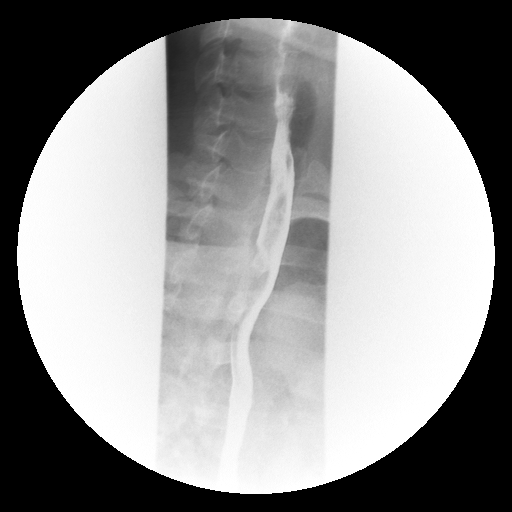

[Series 5: run · 1 of 1 slices shown (5 of 14)]
[im 1/1]
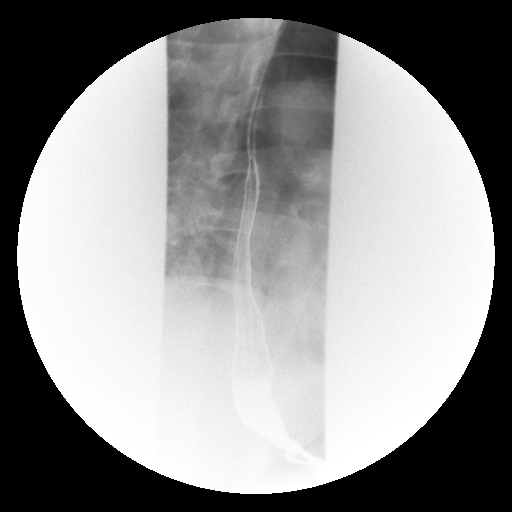

[Series 6: run · 1 of 1 slices shown (6 of 14)]
[im 1/1]
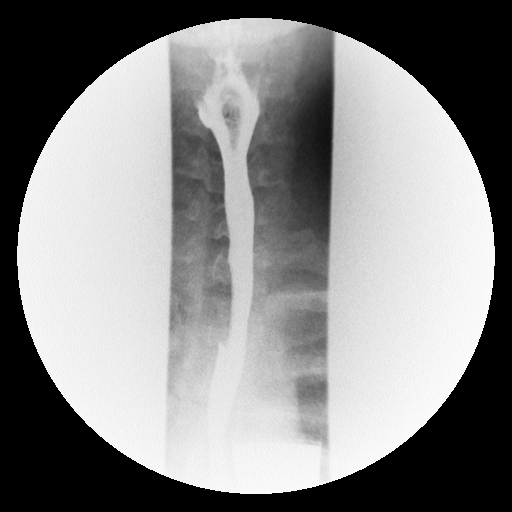

[Series 7: run · 1 of 1 slices shown (7 of 14)]
[im 1/1]
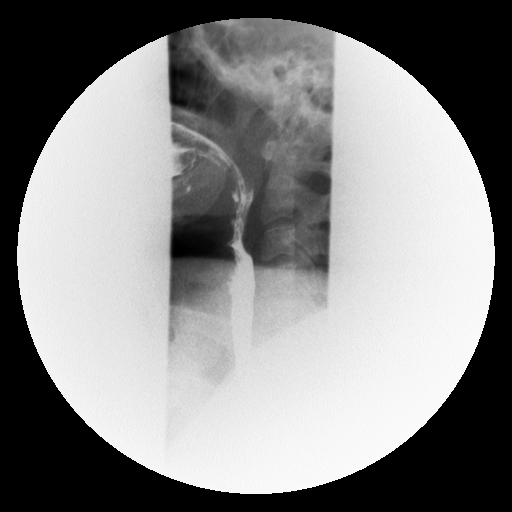

[Series 8: run · 1 of 1 slices shown (8 of 14)]
[im 1/1]
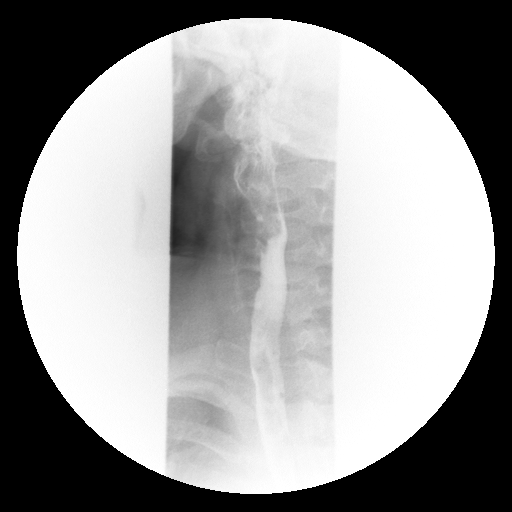

[Series 9: run · 1 of 1 slices shown (9 of 14)]
[im 1/1]
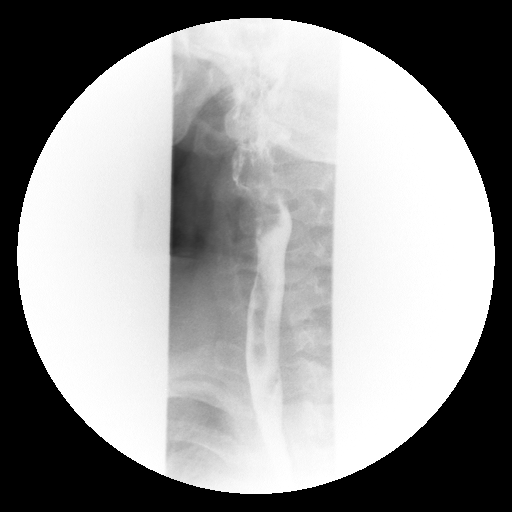

[Series 10: run · 1 of 1 slices shown (10 of 14)]
[im 1/1]
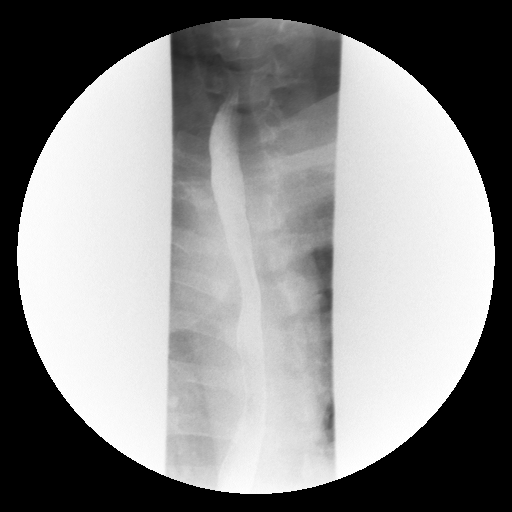

[Series 11: run · 1 of 1 slices shown (11 of 14)]
[im 1/1]
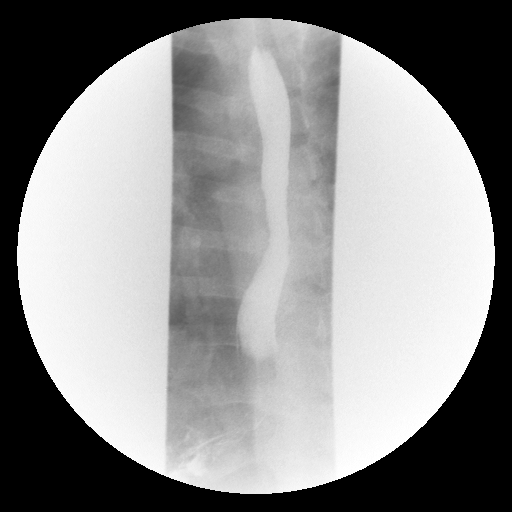

[Series 12: run · 1 of 1 slices shown (12 of 14)]
[im 1/1]
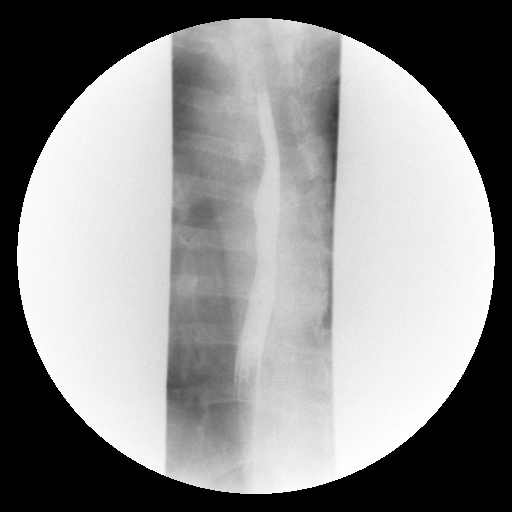

[Series 13: run · 1 of 1 slices shown (13 of 14)]
[im 1/1]
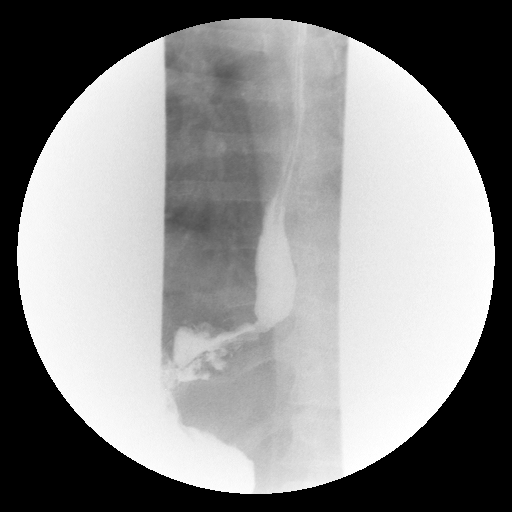

[Series 14: run · 1 of 1 slices shown (14 of 14)]
[im 1/1]
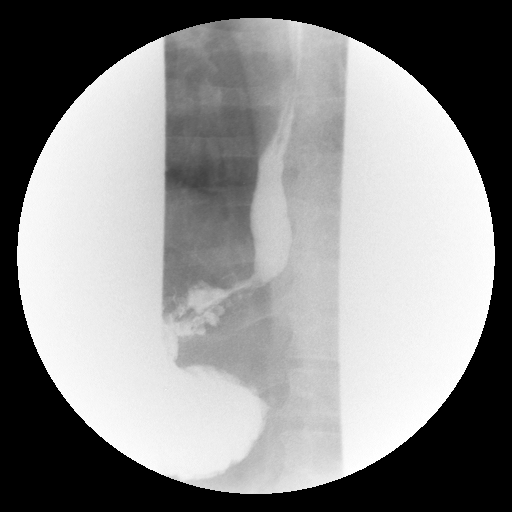

[14 of 14 positions shown; findings below may reference images not displayed]

FINDINGS: The patient swallowed barium without difficulty. Normal esophageal
peristalsis with no hypopharyngeal abnormalities seen. Very small
sliding hiatal hernia with moderate concentric narrowing at the
gastroesophageal junction, smoothly marginated. No gastroesophageal
reflux, mass or ulceration seen. The patient refused to swallow a
barium tablet due to anxiety as a result of choking whenever he
tries to swallow tablets.
IMPRESSION: 1. Very small sliding hiatal hernia.
2. Short segment benign stricture of the distal esophagus at the
gastroesophageal junction with moderate luminal narrowing. Although
the patient was not willing to swallow a barium tablet, it is not
felt that the tablet would be able to pass through this region of
narrowing based on the subjective diameter.

FLUOROSCOPY TIME:
3 min 0 seconds

## 2015-12-01 ENCOUNTER — Encounter: Payer: Self-pay | Admitting: Gastroenterology

## 2016-02-06 ENCOUNTER — Ambulatory Visit: Payer: Medicaid Other | Admitting: Gastroenterology

## 2016-07-28 ENCOUNTER — Encounter (HOSPITAL_BASED_OUTPATIENT_CLINIC_OR_DEPARTMENT_OTHER): Payer: Self-pay | Admitting: Emergency Medicine

## 2016-07-28 ENCOUNTER — Emergency Department (HOSPITAL_BASED_OUTPATIENT_CLINIC_OR_DEPARTMENT_OTHER)
Admission: EM | Admit: 2016-07-28 | Discharge: 2016-07-28 | Disposition: A | Discharge status: Home / Self Care | Payer: Medicaid Other | Attending: Emergency Medicine | Admitting: Emergency Medicine

## 2016-07-28 DIAGNOSIS — I10 Essential (primary) hypertension: Secondary | ICD-10-CM | POA: Diagnosis not present

## 2016-07-28 DIAGNOSIS — Z87891 Personal history of nicotine dependence: Secondary | ICD-10-CM | POA: Diagnosis not present

## 2016-07-28 DIAGNOSIS — G4700 Insomnia, unspecified: Secondary | ICD-10-CM | POA: Insufficient documentation

## 2016-07-28 DIAGNOSIS — F5105 Insomnia due to other mental disorder: Secondary | ICD-10-CM

## 2016-07-28 DIAGNOSIS — F419 Anxiety disorder, unspecified: Secondary | ICD-10-CM | POA: Insufficient documentation

## 2016-07-28 HISTORY — DX: Post-traumatic stress disorder, unspecified: F43.10

## 2016-07-28 MED ORDER — LORAZEPAM 1 MG PO TABS
2.0000 mg | ORAL_TABLET | Freq: Once | ORAL | Status: AC
  Administered 2016-07-28: 2 mg via ORAL
  Filled 2016-07-28: qty 2

## 2016-07-28 NOTE — ED Triage Notes (Signed)
Pt c/o insomnia, anxiety and medication problems. [facility] yesterday for same.

## 2016-07-28 NOTE — ED Provider Notes (Signed)
[facility] Provider Note: Luis DellJ. Lane [name], MD, FACEP  CSN: [id number] MRN: [id number] ARRIVAL: 07/28/16 at 0225 ROOM: MH05/[facility]05   CHIEF COMPLAINT  Anxiety   HISTORY OF PRESENT ILLNESS  Luis Garcia is a 36 y.o. male with a history of anxiety and insomnia. He was recently started on imipramine about 3 days ago. He also takes boost bar, clonazepam 0.5 milligrams twice daily as needed for anxiety. He has been prescribed trazodone but is reticent to take it due to his sleep apnea. He was [facility] yesterday and had a psychiatric evaluation. He is denying suicidal or homicidal ideation. He is here requesting that we "put him to sleep" suggesting "giving me something IV". He states he has had only 2 hours of sleep in the past 2 nights. He does not believe the dose of clonazepam is adequate for his physical size.   He also thinks the imipramine may be causing exacerbation of his PSVT as he frequently has a sensation of a rapid heartbeat at night. He has only taken half of his imipramine dose today.   Past Medical History:  Diagnosis Date  . ANXIETY 03/27/2007  . Atrial fibrillation (HCC) 06/16/2007  . Complication of anesthesia    was told he has small airway  . DEPRESSION 06/16/2007  . Deviated nasal septum 04/15/2007  . GERD (gastroesophageal reflux disease)   . Hypertension   . Hypoglycemia   . Morbid obesity (HCC) 03/12/2008  . PTSD (post-traumatic stress disorder)   . SLEEP APNEA 03/27/2007  . 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 atrial fib ablation    . CHOLECYSTECTOMY    . DENTAL SURGERY      Family History  Problem Relation Age of Onset  . Diabetes Mother   . Hypertension Father   . Hyperlipidemia Father   . Anxiety disorder      Social History  Substance Use Topics  . Smoking status: Former Smoker    Packs/day: 0.50    Years: 8.00    Types: Cigarettes    Quit date:  07/07/2004  . Smokeless tobacco: Never Used     Comment: stopped in 2006- smoked 1/2 ppd for 8 years  . Alcohol use No    Prior to Admission medications   Medication Sig Start Date End Date Taking? Authorizing Provider  ALPRAZolam (XANAX XR) 0.5 MG 24 hr tablet Take 0.5 mg by mouth as needed for anxiety or sleep.     Historical Provider, MD  clonazePAM (KLONOPIN) 0.5 MG tablet Take 0.5 mg by mouth 2 (two) times daily as needed for anxiety.    Historical Provider, MD  escitalopram (LEXAPRO) 10 MG tablet Take 10 mg by mouth daily with breakfast.    Historical Provider, MD  fenofibrate 160 MG tablet Take 1 tablet (160 mg total) by mouth daily. 12/10/13   Sheliah HatchKatherine E Tabori, MD  metoprolol succinate (TOPROL XL) 50 MG 24 hr tablet Take 2.5 tablets by mouth daily. Take with or immediately following a meal. 12/21/13   Sheliah HatchKatherine E Tabori, MD  pantoprazole (PROTONIX) 20 MG tablet Take 1 tablet (20 mg total) by mouth daily. 06/09/13   Stacie GlazeJohn E Jenkins, MD  triamcinolone ointment (KENALOG) 0.1 % Apply 1 application topically 2 (two) times daily. 11/30/13   Sheliah HatchKatherine E Tabori, MD  venlafaxine XR (EFFEXOR-XR) 150 MG 24 hr capsule Take 300 mg by mouth daily with breakfast.    Historical Provider, MD  Allergies Codeine and Sulfa antibiotics   REVIEW OF SYSTEMS  Negative except as noted here or in the History of Present Illness.   PHYSICAL EXAMINATION  Initial Vital Signs Blood pressure 134/84, pulse 106, temperature 98.1 F (36.7 C), temperature source Oral, resp. rate 16, height 6\' 5"  (1.956 m), weight (!) 361 lb (163.7 kg), SpO2 95 %.  Examination General: Well-developed, well-nourished male in no acute distress; appearance consistent with age of record HENT: normocephalic; atraumatic Eyes: pupils equal, round and reactive to light; extraocular muscles intact Neck: supple Heart: regular rate and rhythm Lungs: clear to auscultation bilaterally Abdomen: soft; nondistended; nontender; bowel sounds  present Extremities: No deformity; full range of motion; pulses normal Neurologic: Awake, alert and oriented; motor function intact in all extremities and symmetric; no facial droop Skin: Warm and dry; scattered psoriatic plaques Psychiatric: Flat affect; no SI; no HI  RESULTS  Summary of this visit's results, reviewed by myself:   EKG Interpretation  Date/Time:    Ventricular Rate:    PR Interval:    QRS Duration:   QT Interval:    QTC Calculation:   R Axis:     Text Interpretation:        Laboratory Studies: No results found for this or any previous visit (from the past 24 hour(s)). Imaging Studies: No results found.  ED COURSE  Nursing notes and initial vitals signs, including pulse oximetry, reviewed.  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

## 2016-09-10 ENCOUNTER — Encounter: Payer: Self-pay | Admitting: Internal Medicine

## 2016-09-10 ENCOUNTER — Ambulatory Visit (INDEPENDENT_AMBULATORY_CARE_PROVIDER_SITE_OTHER): Payer: Medicaid Other | Admitting: Internal Medicine

## 2016-09-10 VITALS — BP 138/80 | HR 97 | Ht 77.5 in | Wt 360.5 lb

## 2016-09-10 DIAGNOSIS — I10 Essential (primary) hypertension: Secondary | ICD-10-CM | POA: Diagnosis not present

## 2016-09-10 NOTE — Progress Notes (Signed)
HPI [name] returns today after a 10 year absence from our arrhythmias clinic. 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 He has not had to go to the hospital since then with SVT. He does note recurrent palpitations which are typically short lived. He continues to struggle with insomnia and anxiety. He has not had syncope. He has trouble sleeping at night. He has gained almost 100 lbs since I saw him last over 10 years ago.  Allergies  Allergen Reactions  . Codeine Rash  . Sulfa Antibiotics Rash     Current Outpatient Prescriptions  Medication Sig Dispense Refill  . clonazePAM (KLONOPIN) 1 MG tablet Take 1 mg by mouth 2 (two) times daily.    . metoprolol succinate (TOPROL-XL) 50 MG 24 hr tablet Take 50 mg by mouth 2 (two) times daily before lunch and supper. Take with or immediately following a meal.    . venlafaxine XR (EFFEXOR-XR) 150 MG 24 hr capsule Take 300 mg by mouth daily with breakfast.     No current facility-administered medications for this visit.      Past Medical History:  Diagnosis Date  . ANXIETY 03/27/2007  . Atrial fibrillation (HCC) 06/16/2007  . Complication of anesthesia    was told he has small airway  . DEPRESSION 06/16/2007  . Deviated nasal septum 04/15/2007  . GERD (gastroesophageal reflux disease)   . Hypertension   . Hypoglycemia   . Morbid obesity (HCC) 03/12/2008  . PTSD (post-traumatic stress disorder)   . SLEEP APNEA 03/27/2007  . 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   Past Surgical History:  Procedure Laterality Date  . atrial fib ablation    . CHOLECYSTECTOMY    . DENTAL SURGERY       Family History  Problem Relation Age of Onset  . Diabetes Mother   . Hypertension Father   . Hyperlipidemia Father   . Anxiety  disorder       Social History   Social History  . Marital status: Single    Spouse name: N/A  . Number of children: N/A  . Years of education: N/A   Occupational History  . unemployeed    Social History Main Topics  . Smoking status: Former Smoker    Packs/day: 0.50    Years: 8.00    Types: Cigarettes    Quit date: 07/07/2004  . Smokeless tobacco: Never Used     Comment: stopped in 2006- smoked 1/2 ppd for 8 years  . Alcohol use No  . Drug use: No  . Sexual activity: No   Other Topics Concern  . Not on file   Social History Narrative  . No narrative on file     BP 138/80   Pulse 97   Ht 6' 5.5" (1.969 m)   Wt (!) 360 lb 8 oz (163.5 kg)   SpO2 96%   BMI 42.20 kg/m   Physical Exam:  Well appearing 36 yo man, NAD HEENT: Unremarkable Neck:  6 cm JVD, no thyromegally Lymphatics:  No adenopathy Back:  No CVA tenderness Lungs:  Clear with no wheezes HEART:  Regular rate rhythm, no murmurs, no rubs, no clicks Abd:  soft, positive bowel sounds, no organomegally, no rebound, no guarding Ext:  2 plus pulses, no edema, no cyanosis, no clubbing Skin:  No rashes no nodules Neuro:  CN II through XII intact, motor grossly intact  EKG - nsr with no pre-excitation   Assess/Plan: 1. SVT - he has had recurrent palpitations but has denied sustained SVT. I suspect he has sinus tachycardia. He will undergo watchful waiting. He was placed on a beta blocker and will continue this medication. 2. HTN - his blood pressure is elevated in the office but he thinks it is better at home. I have asked him to reduce his salt intake, and he will continue his bet blocker. 3. Obesity - He is over 100 lbs over weight. I have strongly encouraged the patient to lose weight.

## 2016-09-10 NOTE — Patient Instructions (Signed)
Medication Instructions:  Your physician recommends that you continue on your current medications as directed. Please refer to the Current Medication list given to you today.   Labwork: None Ordered   Testing/Procedures: None Ordered   Follow-Up: Your physician wants you to follow-up in: 1 year with Dr. Taylor.  You will receive a reminder letter in the mail two months in advance. If you don't receive a letter, please call our office to schedule the follow-up appointment.   If you need a refill on your cardiac medications before your next appointment, please call your pharmacy.   Thank you for choosing [facility]! Taylormarie Register, RN [phone]    

## 2016-11-20 ENCOUNTER — Telehealth: Payer: Self-pay | Admitting: *Deleted

## 2016-11-20 NOTE — Telephone Encounter (Signed)
Received [facility] for Medical [facility] from [facility], forwarded to [place] for email/scan/SLS 07/03 Patient last seen Dr. Beverely Lowabori 03/03/2014, bua appears he has [facility]CP [facility].

## 2016-12-14 ENCOUNTER — Encounter (HOSPITAL_BASED_OUTPATIENT_CLINIC_OR_DEPARTMENT_OTHER): Payer: Self-pay | Admitting: Emergency Medicine

## 2016-12-14 ENCOUNTER — Ambulatory Visit: Payer: Self-pay | Admitting: Physician Assistant

## 2016-12-14 ENCOUNTER — Emergency Department (HOSPITAL_BASED_OUTPATIENT_CLINIC_OR_DEPARTMENT_OTHER)
Admission: EM | Admit: 2016-12-14 | Discharge: 2016-12-14 | Disposition: A | Discharge status: Home / Self Care | Payer: Medicaid Other | Attending: Emergency Medicine | Admitting: Emergency Medicine

## 2016-12-14 DIAGNOSIS — Z79899 Other long term (current) drug therapy: Secondary | ICD-10-CM | POA: Insufficient documentation

## 2016-12-14 DIAGNOSIS — I10 Essential (primary) hypertension: Secondary | ICD-10-CM | POA: Insufficient documentation

## 2016-12-14 DIAGNOSIS — J029 Acute pharyngitis, unspecified: Secondary | ICD-10-CM

## 2016-12-14 DIAGNOSIS — Z87891 Personal history of nicotine dependence: Secondary | ICD-10-CM | POA: Diagnosis not present

## 2016-12-14 DIAGNOSIS — R197 Diarrhea, unspecified: Secondary | ICD-10-CM | POA: Diagnosis present

## 2016-12-14 NOTE — ED Notes (Signed)
Pt given scrub pants and mesh underpants

## 2016-12-14 NOTE — Discharge Instructions (Signed)
Tylenol as needed for pain. Gargle warm salt water and spit it out. It is very important to stay hydrated!  Follow up with your primary care doctor in 5-7 days for recheck of ongoing symptoms and return to emergency department if any new or worsening of symptoms develop or you have any additional concerns.

## 2016-12-14 NOTE — ED Provider Notes (Signed)
[facility]Y [facility] Provider Note   CSN: [id number] Arrival date & time: 12/14/16  1015     History   Chief Complaint Chief Complaint  Patient presents with  . Diarrhea    HPI Fara OldenJustin M Delagarza is a 36 y.o. male.  The history is provided by the patient and medical records. No language interpreter was used.   Fara OldenJustin M Walko is a 36 y.o. male  who presents to the Emergency Department complaining of persistent sore throat x 3 days associated with rhinorrhea. He states that his parents recently came to visit him and had similar sxs. He believes he may have caught a virus from them. He has hx of strep throat as a child and would like to make sure he did not have strep again. No fever, chills, cough, shortness of breath. Took cough drops and used chloraseptic spray with little improvement.   Patient additionally notes that he has not had a bowel movement in the last 4 days. While speaking with the nurse just before my examination, patient states that he suddenly had the urge to have BM. He was trying not to be rude, so he tried to hold it in and finish speaking with nurse. Unfortunately, he began to have BM, therefore ran to restroom. States that he had very large, non-bloody bowel movement. Initially was very firm, but at the end had a liquid component. He now feels much improved. No fevers or vomiting. Now with no nausea or abdominal pain.    Past Medical History:  Diagnosis Date  . ANXIETY 03/27/2007  . Atrial fibrillation (HCC) 06/16/2007  . Complication of anesthesia    was told he has small airway  . DEPRESSION 06/16/2007  . Deviated nasal septum 04/15/2007  . GERD (gastroesophageal reflux disease)   . Hypertension   . Hypoglycemia   . Morbid obesity (HCC) 03/12/2008  . PTSD (post-traumatic stress disorder)   . SLEEP APNEA 03/27/2007  . 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 Hypertriglyceridemia 03/07/2014  . Rash  and nonspecific skin eruption 11/30/2013  . Palpitations 11/10/2012  . Hypertension 07/07/2010  . MORBID OBESITY 03/12/2008  . DEPRESSION 06/16/2007  . DEVIATED NASAL SEPTUM 04/15/2007  . Anxiety state 03/27/2007  . OSA (obstructive sleep apnea) 03/27/2007    Past Surgical History:  Procedure Laterality Date  . atrial fib ablation    . CHOLECYSTECTOMY    . DENTAL SURGERY         Home Medications    Prior to Admission medications   Medication Sig Start Date End Date Taking? Authorizing Provider  clonazePAM (KLONOPIN) 1 MG tablet Take 1 mg by mouth 2 (two) times daily.    [provider]  metoprolol succinate (TOPROL-XL) 50 MG 24 hr tablet Take 50 mg by mouth 2 (two) times daily before lunch and supper. Take with or immediately following a meal.    [provider]  venlafaxine XR (EFFEXOR-XR) 150 MG 24 hr capsule Take 300 mg by mouth daily with breakfast.    [provider]    Family History Family History  Problem Relation Age of Onset  . Diabetes Mother   . Hypertension Father   . Hyperlipidemia Father   . Anxiety disorder Unknown     Social History Social History  Substance Use Topics  . Smoking status: Former Smoker    Packs/day: 0.50    Years: 8.00    Types: Cigarettes    Quit  date: 07/07/2004  . Smokeless tobacco: Never Used     Comment: stopped in 2006- smoked 1/2 ppd for 8 years  . Alcohol use No     Allergies   Codeine and Sulfa antibiotics   Review of Systems Review of Systems  HENT: Positive for rhinorrhea and sore throat. Negative for congestion, trouble swallowing and voice change.   Respiratory: Negative for cough.   Gastrointestinal: Negative for vomiting.  All other systems reviewed and are negative.    Physical Exam Updated Vital Signs BP 125/84 (BP Location: Right Arm)   Pulse 98   Temp 98.8 F (37.1 C) (Oral)   Resp 18   Ht 6\' 5"  (1.956 m)   Wt (!) 163.3 kg (360 lb)   SpO2 97%   BMI 42.69 kg/m    Physical Exam  Constitutional: He is oriented to person, place, and time. He appears well-developed and well-nourished. No distress.  Non-toxic appearing.  HENT:  Head: Normocephalic and atraumatic.  OP with erythema, but no tonsillar hypertrophy or exudates. No focal areas of sinus tenderness.  Cardiovascular: Normal rate and normal heart sounds.   No murmur heard. Pulmonary/Chest: Effort normal and breath sounds normal. No respiratory distress.  Abdominal: Soft. Bowel sounds are normal. He exhibits no distension.  No abdominal tenderness.  Musculoskeletal: Normal range of motion.  Neurological: He is alert and oriented to person, place, and time.  Skin: Skin is warm and dry.  Nursing note and vitals reviewed.    ED Treatments / Results  Labs (all labs ordered are listed, but only abnormal results are displayed) Labs Reviewed - No data to display  EKG  EKG Interpretation None       Radiology No results found.  Procedures Procedures (including critical care time)  Medications Ordered in ED Medications - No data to display   Initial Impression / Assessment and Plan / ED Course  I have reviewed the triage vital signs and the nursing notes.  Pertinent labs & imaging results that were available during my care of the patient were reviewed by me and considered in my medical decision making (see chart for details).    Fara OldenJustin M Wymer is a 36 y.o. male who presents to ED for sore throat. OP with erythema, but no exudates or tonsillar hypertrophy. Likely 2/2 viral illness. While in ED, patient began complaining of abdominal pain which was immediately relieved by large BM. Following BM, patient with benign abdominal exam and no complaints of abdominal pain / nausea. Discussed home care / dietary modifications for good bowel health. PCP follow up if symptoms persist. Reasons to return to ER discussed and all questions answered.    Final Clinical Impressions(s) / ED  Diagnoses   Final diagnoses:  Viral pharyngitis    New Prescriptions Discharge Medication List as of 12/14/2016 11:16 AM       Trevor Wilkie, Chase PicketJaime Pilcher, PA-C 12/14/16 1205    Mesner, Barbara CowerJason, MD 12/14/16 1241

## 2016-12-14 NOTE — ED Triage Notes (Signed)
Pt presents with c/o of sore throat, diarrhea, and abdominal pain for few days.

## 2017-05-20 ENCOUNTER — Telehealth: Payer: Self-pay | Admitting: Internal Medicine

## 2017-05-20 NOTE — Telephone Encounter (Signed)
Returned call to Pt.  Per Pt he is having increasing palpitations with pauses.  Per Pt he is taking 100 mg Toprol XL BID, clonidine 0.1 mg BID, androgel, and clonazepam and effexor.  Will have Pt f/u with Dr. Ladona Ridgelaylor.  Pt states any day in January 2019 is ok.  Given to scheduling for appt.

## 2017-05-20 NOTE — Telephone Encounter (Signed)
New Message  Patient c/o Palpitations:  High priority if patient c/o lightheadedness, shortness of breath, or chest pain  How long have you had palpitations/irregular HR/ Afib? Are you having the symptoms now? No per pt off and on for 4 days 1) Are you currently experiencing lightheadedness, SOB or CP? No   2) Do you have a history of afib (atrial fibrillation) or irregular heart rhythm? No   3) Have you checked your BP or HR? (document readings if available): no   4) Are you experiencing any other symptoms? No

## 2017-05-27 NOTE — Telephone Encounter (Signed)
Appt made with Amber.  No further action needed at this time.

## 2017-05-29 NOTE — Progress Notes (Signed)
Electrophysiology Office Note Date: 05/31/2017  ID:  Luis Garcia, DOB May 21, 1981, MRN [id number]  PCP: Laqueta Due., MD Electrophysiologist: Ladona Ridgel  CC: palpitations  Luis Garcia is a 37 y.o. male seen today for Dr Ladona Ridgel.  He presents today for routine electrophysiology followup.  Since last being seen in our clinic, the patient reports doing relatively well.  He had several days of palpitations for which he asked to be seen today. He says these feel like when his SVT would start before but then it doesn't.  He has no other associated symptoms, they are just annoying. He is aware that he needs to lose weight but is having trouble managing diet.  He also has anxiety and depression that factor in.   He denies chest pain, dyspnea, PND, orthopnea, nausea, vomiting, dizziness, syncope, edema, weight gain, or early satiety.  Past Medical History:  Diagnosis Date  . ANXIETY 03/27/2007  . Atrial fibrillation (HCC) 06/16/2007  . Complication of anesthesia    was told he has small airway  . DEPRESSION 06/16/2007  . Deviated nasal septum 04/15/2007  . GERD (gastroesophageal reflux disease)   . Hypertension   . Hypoglycemia   . Morbid obesity (HCC) 03/12/2008  . PTSD (post-traumatic stress disorder)   . SLEEP APNEA 03/27/2007  . SUPRAVENTRICULAR TACHYCARDIA 03/27/2007   Past Surgical History:  Procedure Laterality Date  . atrial fib ablation    . CHOLECYSTECTOMY    . DENTAL SURGERY      Current Outpatient Medications  Medication Sig Dispense Refill  . clonazePAM (KLONOPIN) 1 MG tablet Take 1 mg by mouth 2 (two) times daily.    . cloNIDine (CATAPRES) 0.1 MG tablet Take 0.1 mg by mouth 2 (two) times daily.    . metoprolol succinate (TOPROL-XL) 50 MG 24 hr tablet Take 50 mg by mouth 2 (two) times daily before lunch and supper. Take with or immediately following a meal.    . venlafaxine XR (EFFEXOR-XR) 150 MG 24 hr capsule Take 300 mg by mouth daily with breakfast.     No  current facility-administered medications for this visit.     Allergies:   Codeine and Sulfa antibiotics   Social History: Social History   Socioeconomic History  . Marital status: Single    Spouse name: Not on file  . Number of children: Not on file  . Years of education: Not on file  . Highest education level: Not on file  Social Needs  . Financial resource strain: Not on file  . Food insecurity - worry: Not on file  . Food insecurity - inability: Not on file  . Transportation needs - medical: Not on file  . Transportation needs - non-medical: Not on file  Occupational History  . Occupation: unemployeed  Tobacco Use  . Smoking status: Former Smoker    Packs/day: 0.50    Years: 8.00    Pack years: 4.00    Types: Cigarettes    Last attempt to quit: 07/07/2004    Years since quitting: 12.9  . Smokeless tobacco: Never Used  . Tobacco comment: stopped in 2006- smoked 1/2 ppd for 8 years  Substance and Sexual Activity  . Alcohol use: No  . Drug use: No  . Sexual activity: No  Other Topics Concern  . Not on file  Social History Narrative  . Not on file    Family History: Family History  Problem Relation Age of Onset  . Diabetes Mother   . Hypertension  Father   . Hyperlipidemia Father   . Anxiety disorder Unknown     Review of Systems: All other systems reviewed and are otherwise negative except as noted above.   Physical Exam: VS:  BP 126/84   Pulse 85   Ht 6\' 5"  (1.956 m)   Wt (!) 358 lb (162.4 kg)   BMI 42.45 kg/m  , BMI Body mass index is 42.45 kg/m. Wt Readings from Last 3 Encounters:  05/31/17 (!) 358 lb (162.4 kg)  12/14/16 (!) 360 lb (163.3 kg)  09/10/16 (!) 360 lb 8 oz (163.5 kg)    GEN- The patient is morbidly obese appearing, alert and oriented x 3 today.   HEENT: normocephalic, atraumatic; sclera clear, conjunctiva pink; hearing intact; oropharynx clear; neck supple Lungs- Clear to ausculation bilaterally, normal work of breathing.  No  wheezes, rales, rhonchi Heart- Regular rate and rhythm GI- soft, non-tender, non-distended, bowel sounds present Extremities- no clubbing, cyanosis, or edema MS- no significant deformity or atrophy Skin- warm and dry, no rash or lesion  Psych- euthymic mood, full affect Neuro- strength and sensation are intact   EKG:  EKG is ordered today. The ekg ordered today shows sinus rhythm  Recent Labs: No results found for requested labs within last 8760 hours.    Other studies Reviewed: Additional studies/ records that were reviewed today include: Dr Lubertha Basqueaylor's office notes  Assessment and Plan:  1.  Palpitations S/p successful slow pathway modification several years ago Palpitations sound like PAC's/PVC's.  I have advised that these are benign but that he can take extra Metoprolol if needed  2.  Morbid obesity Body mass index is 42.45 kg/m. Weight loss will be essential to long term health We discussed referral to bariatric surgery today.  He is interested in going for referral for education. We will arrange.   3.  HTN Stable No change required today    Current medicines are reviewed at length with the patient today.   The patient does not have concerns regarding his medicines.  The following changes were made today:  none  Labs/ tests ordered today include: none No orders of the defined types were placed in this encounter.    Disposition:   Follow up with Dr Ladona Ridgelaylor 1 year    Signed, Gypsy BalsamAmber Sami Froh, NP 05/31/2017 10:53 AM   [facility] [address] [place] [address] [phone] (office) [phone] (fax)

## 2017-05-31 ENCOUNTER — Ambulatory Visit: Payer: Medicaid Other | Admitting: Nurse Practitioner

## 2017-05-31 VITALS — BP 126/84 | HR 85 | Ht 77.0 in | Wt 358.0 lb

## 2017-05-31 DIAGNOSIS — I10 Essential (primary) hypertension: Secondary | ICD-10-CM

## 2017-05-31 DIAGNOSIS — R002 Palpitations: Secondary | ICD-10-CM

## 2017-05-31 NOTE — Patient Instructions (Addendum)
Medication Instructions:   Your physician recommends that you continue on your current medications as directed. Please refer to the Current Medication list given to you today.   If you need a refill on your cardiac medications before your next appointment, please call your pharmacy.  Labwork:  NONE ORDERED  TODAY     Testing/Procedures:  NONE ORDERED  TODAY    Follow-Up: Your physician wants you to follow-up in:  IN A YEAR  WITH DR Court JoyAYLOR   You will receive a reminder letter in the mail two months in advance. If you don't receive a letter, please call our office to schedule the follow-up appointment.  You have been referred to Bariatric Surgery   Please contact [facility]  [phone] for further information    Any Other Special Instructions Will Be Listed Below (If Applicable).

## 2017-06-03 NOTE — Addendum Note (Signed)
Addended by: Oleta MouseVERTON, Traycen Goyer M on: 06/03/2017 07:10 AM   Modules accepted: Orders

## 2018-12-31 ENCOUNTER — Other Ambulatory Visit: Payer: Self-pay

## 2018-12-31 DIAGNOSIS — Z20822 Contact with and (suspected) exposure to covid-19: Secondary | ICD-10-CM

## 2019-01-01 LAB — NOVEL CORONAVIRUS, NAA: SARS-CoV-2, NAA: NOT DETECTED
